# Patient Record
Sex: Female | Born: 1950
Health system: Southern US, Community
[De-identification: ages and names within clinical notes are randomized; demographics above are authoritative.]

## PROBLEM LIST (undated history)

## (undated) DIAGNOSIS — Z923 Personal history of irradiation: Secondary | ICD-10-CM

## (undated) DIAGNOSIS — T8859XA Other complications of anesthesia, initial encounter: Secondary | ICD-10-CM

## (undated) DIAGNOSIS — C50919 Malignant neoplasm of unspecified site of unspecified female breast: Secondary | ICD-10-CM

## (undated) HISTORY — PX: PLACEMENT OF BREAST IMPLANTS: SHX6334

## (undated) HISTORY — DX: Malignant neoplasm of unspecified site of unspecified female breast: C50.919

## (undated) HISTORY — PX: BREAST LUMPECTOMY: SHX2

## (undated) HISTORY — DX: Personal history of irradiation: Z92.3

## (undated) HISTORY — PX: OTHER SURGICAL HISTORY: SHX169

---

## 1999-07-11 ENCOUNTER — Encounter: Admission: RE | Admit: 1999-07-11 | Discharge: 1999-07-11 | Payer: Self-pay | Admitting: Obstetrics and Gynecology

## 1999-07-11 ENCOUNTER — Encounter: Payer: Self-pay | Admitting: Obstetrics and Gynecology

## 2000-01-18 ENCOUNTER — Inpatient Hospital Stay (HOSPITAL_COMMUNITY): Admission: EM | Admit: 2000-01-18 | Discharge: 2000-01-20 | Payer: Self-pay | Admitting: Emergency Medicine

## 2000-01-18 ENCOUNTER — Ambulatory Visit (HOSPITAL_COMMUNITY): Admission: RE | Admit: 2000-01-18 | Discharge: 2000-01-18 | Payer: Self-pay | Admitting: *Deleted

## 2000-01-19 ENCOUNTER — Encounter: Payer: Self-pay | Admitting: Otolaryngology

## 2000-10-30 ENCOUNTER — Encounter: Admission: RE | Admit: 2000-10-30 | Discharge: 2000-10-30 | Payer: Self-pay | Admitting: Obstetrics and Gynecology

## 2000-10-30 ENCOUNTER — Encounter: Payer: Self-pay | Admitting: Obstetrics and Gynecology

## 2001-02-17 ENCOUNTER — Other Ambulatory Visit: Admission: RE | Admit: 2001-02-17 | Discharge: 2001-02-17 | Payer: Self-pay | Admitting: Obstetrics and Gynecology

## 2002-04-28 ENCOUNTER — Encounter: Admission: RE | Admit: 2002-04-28 | Discharge: 2002-04-28 | Payer: Self-pay | Admitting: Obstetrics and Gynecology

## 2002-04-28 ENCOUNTER — Encounter: Payer: Self-pay | Admitting: Obstetrics and Gynecology

## 2003-06-15 ENCOUNTER — Encounter: Admission: RE | Admit: 2003-06-15 | Discharge: 2003-06-15 | Payer: Self-pay | Admitting: Obstetrics and Gynecology

## 2005-03-29 ENCOUNTER — Encounter: Admission: RE | Admit: 2005-03-29 | Discharge: 2005-03-29 | Payer: Self-pay | Admitting: Obstetrics and Gynecology

## 2006-07-10 ENCOUNTER — Encounter: Admission: RE | Admit: 2006-07-10 | Discharge: 2006-07-10 | Payer: Self-pay | Admitting: Obstetrics and Gynecology

## 2007-03-12 ENCOUNTER — Emergency Department (HOSPITAL_COMMUNITY): Admission: EM | Admit: 2007-03-12 | Discharge: 2007-03-12 | Payer: Self-pay | Admitting: Emergency Medicine

## 2007-03-19 ENCOUNTER — Encounter: Admission: RE | Admit: 2007-03-19 | Discharge: 2007-03-26 | Payer: Self-pay | Admitting: Family Medicine

## 2007-12-08 ENCOUNTER — Encounter: Admission: RE | Admit: 2007-12-08 | Discharge: 2007-12-08 | Payer: Self-pay | Admitting: Obstetrics and Gynecology

## 2008-02-08 ENCOUNTER — Emergency Department (HOSPITAL_COMMUNITY): Admission: EM | Admit: 2008-02-08 | Discharge: 2008-02-08 | Payer: Self-pay | Admitting: Emergency Medicine

## 2008-03-12 ENCOUNTER — Emergency Department (HOSPITAL_COMMUNITY): Admission: EM | Admit: 2008-03-12 | Discharge: 2008-03-12 | Payer: Self-pay | Admitting: Family Medicine

## 2009-03-20 ENCOUNTER — Encounter: Admission: RE | Admit: 2009-03-20 | Discharge: 2009-03-20 | Payer: Self-pay | Admitting: Obstetrics and Gynecology

## 2010-08-24 NOTE — H&P (Signed)
[place]. [facility]  Patient:    Loretta Griffin, Loretta Griffin                    MRN: [id number] Adm. Date:  [id number] Attending:  Serena Colonel H CC:         Coralee North, M.D., Endocrinology  Jethro Bastos, M.D.   History and Physical  ADMISSION DIAGNOSIS: Deep neck space cellulitis.  HISTORY OF PRESENT ILLNESS: This patient is a 60 year old lady who started having severe pain in the anterior neck, tenderness, and severe odynophagia about 48 hours ago that has progressively worsened.  She was evaluated by Dr. Woodfin Ganja earlier today and sent over to Dr. Coralee North for evaluation of possible thyroiditis.  She underwent CT scan of the neck which identified soft tissue edema without abscess and some slight enhancement, possible adenopathy of the lower anterolateral cervical tissues on the left side, with some extension into the superior mediastinum.  She has a history of a foreign body ingestion several years ago that was a chicken bone.  No treatment was necessary and it was felt that it passed.  She has a history of having some dental cleaning work done about ten days prior to symptoms onset.  She has no recent history of ingestion of anything that stuck in her throat or any other infectious or midline problems recently.  She is on no medications.  She does not recall having eaten anything different lately.  PAST MEDICAL HISTORY: Completely negative otherwise.  PAST SURGICAL HISTORY: Negative.  SOCIAL HISTORY: She does not smoke or drink.  She works as a Engineer, civil (consulting) at [employer].  CURRENT MEDICATIONS: None.  ALLERGIES: No known drug allergies.  PHYSICAL EXAMINATION:  GENERAL: She is a pleasant, healthy appearing woman, in moderate distress secondary to anterior neck pain and severe difficulty swallowing.  She is otherwise awake and alert.  VITAL SIGNS: On admission to the ER her temperature was 97.3 degrees, blood pressure 114/67, pulse 81,  respirations 20 and unlabored.  During the time in the emergency department she was found to have a fever to 101 degrees.  NECK: Cervical examination reveals some deep soft tissue swelling and fullness of the anterolateral aspect of the lower neck on the left side.  She has severe tenderness in this area as well.  HEENT: Ears are clear to inspection, no evidence of infection or fluid.  Nasal examination is clear and completely unremarkable.  Oral cavity and pharynx are clear without any evidence of pharyngitis.  There is no neoplasm and no evidence of inflammation.  Indirect laryngoscopy reveals normal appearing vocal cords, normal appearing epiglottis, normal vallecula and piriform sinuses.  The interarytenoid mucosa is slightly edematous.  LABORATORY DATA: Neutrophil count elevated at 82% noted on Differential; CBC report not available at that time.  I reviewed the CT scan with Dr. Myles Rosenthal and there is evidence of soft tissue edema without any evidence of fluid, gas, or abscess in the parapharyngeal or deep cervical spaces.  The mediastinum is not widened, although there is some adenopathy in the superior mediastinum and anterolaterally as well, also on the left side.  No other abnormalities are identified in the sinuses, oral cavity, or upper cervical area.  The suprahyoid neck is completely unremarkable.  IMPRESSION: Acute onset of severe odynophagia with inability to tolerate p.o.s and recent onset of fever along with swelling of the neck.  This is worrisome for early cellulitis or possibly phlegmon of the paraesophageal tissues.  I am not sure of the source of this.  The possibility of angioedema is also present except for the fever.  PLAN: The recommendation is to admit her to the hospital for intravenous antibiotics.  Will start her on Unasyn 3 g q.6h and will follow her with serial examinations.  If the findings worsen we will repeat CT scan.  If she improves, then no  further action will be necessary. DD:  01/18/00 TD:  01/19/00 Job: [id number]

## 2010-12-04 ENCOUNTER — Other Ambulatory Visit: Payer: Self-pay | Admitting: Obstetrics and Gynecology

## 2010-12-04 DIAGNOSIS — Z1231 Encounter for screening mammogram for malignant neoplasm of breast: Secondary | ICD-10-CM

## 2010-12-07 ENCOUNTER — Ambulatory Visit
Admission: RE | Admit: 2010-12-07 | Discharge: 2010-12-07 | Disposition: A | Discharge status: Home / Self Care | Payer: Commercial Managed Care - PPO | Source: Ambulatory Visit | Attending: Obstetrics and Gynecology | Admitting: Obstetrics and Gynecology

## 2010-12-07 DIAGNOSIS — Z1231 Encounter for screening mammogram for malignant neoplasm of breast: Secondary | ICD-10-CM

## 2012-09-18 ENCOUNTER — Other Ambulatory Visit: Payer: Self-pay

## 2012-09-18 DIAGNOSIS — Z1231 Encounter for screening mammogram for malignant neoplasm of breast: Secondary | ICD-10-CM

## 2012-10-21 ENCOUNTER — Ambulatory Visit
Admission: RE | Admit: 2012-10-21 | Discharge: 2012-10-21 | Disposition: A | Discharge status: Home / Self Care | Payer: 59 | Source: Ambulatory Visit

## 2012-10-21 DIAGNOSIS — Z1231 Encounter for screening mammogram for malignant neoplasm of breast: Secondary | ICD-10-CM

## 2013-11-22 ENCOUNTER — Ambulatory Visit
Admission: RE | Admit: 2013-11-22 | Discharge: 2013-11-22 | Disposition: A | Discharge status: Home / Self Care | Payer: 59 | Source: Ambulatory Visit

## 2013-11-22 ENCOUNTER — Other Ambulatory Visit: Payer: Self-pay

## 2013-11-22 ENCOUNTER — Encounter (INDEPENDENT_AMBULATORY_CARE_PROVIDER_SITE_OTHER): Payer: Self-pay

## 2013-11-22 DIAGNOSIS — Z1231 Encounter for screening mammogram for malignant neoplasm of breast: Secondary | ICD-10-CM

## 2015-04-14 MED FILL — ALPHAGAN P 0.1% DROPS: 0.1 | 25 days supply | Qty: 5 | Fill #0

## 2015-04-17 MED FILL — RESTASIS 0.05% EYE EMULSION: 0.05 | 30 days supply | Qty: 60 | Fill #0

## 2015-05-23 MED FILL — ALPHAGAN P 0.1% DROPS: 0.1 | 25 days supply | Qty: 5 | Fill #0

## 2015-07-03 DIAGNOSIS — H02423 Myogenic ptosis of bilateral eyelids: Secondary | ICD-10-CM | POA: Diagnosis not present

## 2015-07-03 DIAGNOSIS — H11433 Conjunctival hyperemia, bilateral: Secondary | ICD-10-CM | POA: Diagnosis not present

## 2015-07-03 DIAGNOSIS — H02831 Dermatochalasis of right upper eyelid: Secondary | ICD-10-CM | POA: Diagnosis not present

## 2015-07-03 DIAGNOSIS — Q100 Congenital ptosis: Secondary | ICD-10-CM | POA: Diagnosis not present

## 2015-07-03 DIAGNOSIS — H02834 Dermatochalasis of left upper eyelid: Secondary | ICD-10-CM | POA: Diagnosis not present

## 2015-07-05 MED FILL — ALPHAGAN P 0.1% DROPS: 0.1 | 25 days supply | Qty: 5 | Fill #1

## 2015-07-17 DIAGNOSIS — H53483 Generalized contraction of visual field, bilateral: Secondary | ICD-10-CM | POA: Diagnosis not present

## 2015-08-23 MED FILL — ALPHAGAN P 0.1% DROPS: 0.1 | 25 days supply | Qty: 5 | Fill #2

## 2015-09-11 DIAGNOSIS — H18412 Arcus senilis, left eye: Secondary | ICD-10-CM | POA: Diagnosis not present

## 2015-09-11 DIAGNOSIS — H18411 Arcus senilis, right eye: Secondary | ICD-10-CM | POA: Diagnosis not present

## 2015-09-11 DIAGNOSIS — Z961 Presence of intraocular lens: Secondary | ICD-10-CM | POA: Diagnosis not present

## 2015-10-04 MED FILL — ALPHAGAN P 0.1% DROPS: 0.1 | 25 days supply | Qty: 5 | Fill #3

## 2015-10-24 MED FILL — RESTASIS 0.05% EYE EMULSION: 0.05 | 30 days supply | Qty: 60 | Fill #1

## 2015-11-14 MED FILL — NEO/POLY/DEXAMET EYE OINT: 3.5-10000-0 | 3 days supply | Qty: 4 | Fill #0

## 2015-11-16 DIAGNOSIS — Z13220 Encounter for screening for lipoid disorders: Secondary | ICD-10-CM | POA: Diagnosis not present

## 2015-11-16 DIAGNOSIS — Z23 Encounter for immunization: Secondary | ICD-10-CM | POA: Diagnosis not present

## 2015-11-16 DIAGNOSIS — Z1211 Encounter for screening for malignant neoplasm of colon: Secondary | ICD-10-CM | POA: Diagnosis not present

## 2015-11-16 DIAGNOSIS — Z131 Encounter for screening for diabetes mellitus: Secondary | ICD-10-CM | POA: Diagnosis not present

## 2015-11-16 DIAGNOSIS — Z0000 Encounter for general adult medical examination without abnormal findings: Secondary | ICD-10-CM | POA: Diagnosis not present

## 2015-11-22 MED FILL — ALPHAGAN P 0.1% DROPS: 0.1 | 25 days supply | Qty: 5 | Fill #1

## 2015-11-27 DIAGNOSIS — H02834 Dermatochalasis of left upper eyelid: Secondary | ICD-10-CM | POA: Diagnosis not present

## 2015-11-27 DIAGNOSIS — H02831 Dermatochalasis of right upper eyelid: Secondary | ICD-10-CM | POA: Diagnosis not present

## 2015-11-27 DIAGNOSIS — H02423 Myogenic ptosis of bilateral eyelids: Secondary | ICD-10-CM | POA: Diagnosis not present

## 2016-01-10 DIAGNOSIS — Z1389 Encounter for screening for other disorder: Secondary | ICD-10-CM | POA: Diagnosis not present

## 2016-01-10 DIAGNOSIS — Z01419 Encounter for gynecological examination (general) (routine) without abnormal findings: Secondary | ICD-10-CM | POA: Diagnosis not present

## 2016-01-10 DIAGNOSIS — Z130 Encounter for screening for diseases of the blood and blood-forming organs and certain disorders involving the immune mechanism: Secondary | ICD-10-CM | POA: Diagnosis not present

## 2016-01-10 DIAGNOSIS — Z1231 Encounter for screening mammogram for malignant neoplasm of breast: Secondary | ICD-10-CM | POA: Diagnosis not present

## 2016-01-10 DIAGNOSIS — Z124 Encounter for screening for malignant neoplasm of cervix: Secondary | ICD-10-CM | POA: Diagnosis not present

## 2016-01-11 MED FILL — ALPHAGAN P 0.1% DROPS: 0.1 | 25 days supply | Qty: 5 | Fill #2

## 2016-02-01 MED FILL — ATOVAQUONE-PROGUANIL 250-10: 250-100 | 30 days supply | Qty: 30 | Fill #0

## 2016-02-01 MED FILL — AZITHROMYCIN 500 MG TABLET: 500 | 3 days supply | Qty: 3 | Fill #0

## 2016-02-01 MED FILL — ONDANSETRON HCL 4 MG TABLET: 4 | 4 days supply | Qty: 15 | Fill #0

## 2016-03-11 MED FILL — RESTASIS 0.05% EYE EMULSION: 0.05 | 30 days supply | Qty: 60 | Fill #2

## 2016-03-11 MED FILL — ALPHAGAN P 0.1% DROPS: 0.1 | 25 days supply | Qty: 5 | Fill #3

## 2016-03-21 DIAGNOSIS — Z961 Presence of intraocular lens: Secondary | ICD-10-CM | POA: Diagnosis not present

## 2016-03-21 DIAGNOSIS — D3131 Benign neoplasm of right choroid: Secondary | ICD-10-CM | POA: Diagnosis not present

## 2016-03-21 DIAGNOSIS — H401131 Primary open-angle glaucoma, bilateral, mild stage: Secondary | ICD-10-CM | POA: Diagnosis not present

## 2016-03-21 DIAGNOSIS — H18413 Arcus senilis, bilateral: Secondary | ICD-10-CM | POA: Diagnosis not present

## 2016-04-16 DIAGNOSIS — H903 Sensorineural hearing loss, bilateral: Secondary | ICD-10-CM | POA: Diagnosis not present

## 2016-04-19 DIAGNOSIS — H903 Sensorineural hearing loss, bilateral: Secondary | ICD-10-CM | POA: Diagnosis not present

## 2016-09-19 DIAGNOSIS — H401131 Primary open-angle glaucoma, bilateral, mild stage: Secondary | ICD-10-CM | POA: Diagnosis not present

## 2016-10-02 ENCOUNTER — Encounter: Payer: Self-pay | Admitting: Podiatry

## 2016-10-02 ENCOUNTER — Ambulatory Visit (INDEPENDENT_AMBULATORY_CARE_PROVIDER_SITE_OTHER): Payer: PPO

## 2016-10-02 ENCOUNTER — Ambulatory Visit (INDEPENDENT_AMBULATORY_CARE_PROVIDER_SITE_OTHER): Payer: PPO | Admitting: Podiatry

## 2016-10-02 DIAGNOSIS — M84374A Stress fracture, right foot, initial encounter for fracture: Secondary | ICD-10-CM | POA: Diagnosis not present

## 2016-10-02 DIAGNOSIS — R87810 Cervical high risk human papillomavirus (HPV) DNA test positive: Secondary | ICD-10-CM | POA: Insufficient documentation

## 2016-10-02 DIAGNOSIS — M7751 Other enthesopathy of right foot: Secondary | ICD-10-CM | POA: Diagnosis not present

## 2016-10-02 DIAGNOSIS — M779 Enthesopathy, unspecified: Principal | ICD-10-CM

## 2016-10-02 DIAGNOSIS — M778 Other enthesopathies, not elsewhere classified: Secondary | ICD-10-CM

## 2016-10-02 DIAGNOSIS — R81 Glycosuria: Secondary | ICD-10-CM | POA: Insufficient documentation

## 2016-10-02 DIAGNOSIS — D259 Leiomyoma of uterus, unspecified: Secondary | ICD-10-CM | POA: Insufficient documentation

## 2016-10-02 NOTE — Progress Notes (Signed)
Subjective:    Patient ID: Loretta Griffin, female   DOB: 66 y.o.   MRN: [id number]   HPI patient states that she's been walking a lot for exercise and getting ready for a hike and she started develop severe pain in her right foot approximate 4 days ago and she cannot bear weight on her forefoot. States that she does not remember specific injury    Review of Systems  All other systems reviewed and are negative.       Objective:  Physical Exam  Cardiovascular: Intact distal pulses.   Musculoskeletal: Normal range of motion.  Neurological: She is alert.  Skin: Skin is warm.  Nursing note and vitals reviewed.  neurovascular status intact muscle strength adequate range of motion within normal limits with patient found to have exquisite discomfort in the fourth metatarsal distal shaft that is very painful when pressed. Patient states there is swelling in the area and she cannot bear weight down on it and is noted to have good digital perfusion and well oriented 3     Assessment:    Probability for stress fracture of the right fourth metatarsal distal shaft     Plan:   H&P x-ray performed and discussed. Due to the acute nature of symptoms and the fact we want to keep this from progressing and becoming worse fracture I did go ahead today and applied air fracture walker to completely immobilize the plantar foot and stop weightbearing on the metatarsal and patient tolerated well and was given all instructions on usage. She'll be seen back again in 2 weeks for reevaluation and hopefully we will be able to start getting her out of the boot at that time  X-ray indicates what is probably the beginning of a small crack line in the distal fourth metatarsal shaft right

## 2016-10-02 NOTE — Progress Notes (Signed)
   Subjective:    Patient ID: Loretta Griffin, female    DOB: 10-05-1950, 66 y.o.   MRN: [id number]  HPI Chief Complaint  Patient presents with  . Foot Pain    Forefoot right - patient states that she has been training for a big walk in [place] next year and noticed 4 days ago she was having a lot of pain and swelling, wrapping with tape      Review of Systems  All other systems reviewed and are negative.      Objective:   Physical Exam        Assessment & Plan:

## 2016-10-16 ENCOUNTER — Ambulatory Visit (INDEPENDENT_AMBULATORY_CARE_PROVIDER_SITE_OTHER): Payer: PPO | Admitting: Podiatry

## 2016-10-16 ENCOUNTER — Encounter: Payer: Self-pay | Admitting: Podiatry

## 2016-10-16 VITALS — BP 131/76 | HR 66 | Resp 16

## 2016-10-16 DIAGNOSIS — M84374A Stress fracture, right foot, initial encounter for fracture: Secondary | ICD-10-CM | POA: Diagnosis not present

## 2016-10-16 NOTE — Progress Notes (Signed)
Subjective:    Patient ID: Loretta Griffin, female   DOB: 66 y.o.   MRN: [id number]   HPI patient states she's improved but still having some discomfort and states that she is doing the [employer] in a year and is concerned because her foot still feels have    ROS      Objective:  Physical Exam neurovascular status intact with patient still having some edema in the dorsum of the right foot but it is improved from previous     Assessment:   Probable stress fracture right improving with boot usage with possibility for low-grade inflammatory condition     Plan:    H&P and education rendered concerning condition. At this point were going to continue boot usage with gradual reduction over the next 4 weeks reevaluate and decide what type of orthotic would be best

## 2016-10-23 ENCOUNTER — Ambulatory Visit: Payer: Self-pay | Admitting: Podiatry

## 2016-11-20 ENCOUNTER — Encounter: Payer: Self-pay | Admitting: Podiatry

## 2016-11-20 ENCOUNTER — Ambulatory Visit (INDEPENDENT_AMBULATORY_CARE_PROVIDER_SITE_OTHER): Payer: PPO | Admitting: Podiatry

## 2016-11-20 DIAGNOSIS — Z23 Encounter for immunization: Secondary | ICD-10-CM | POA: Diagnosis not present

## 2016-11-20 DIAGNOSIS — M7751 Other enthesopathy of right foot: Secondary | ICD-10-CM

## 2016-11-20 DIAGNOSIS — Z1159 Encounter for screening for other viral diseases: Secondary | ICD-10-CM | POA: Diagnosis not present

## 2016-11-20 DIAGNOSIS — M84374A Stress fracture, right foot, initial encounter for fracture: Secondary | ICD-10-CM | POA: Diagnosis not present

## 2016-11-20 DIAGNOSIS — M779 Enthesopathy, unspecified: Secondary | ICD-10-CM

## 2016-11-20 DIAGNOSIS — E7800 Pure hypercholesterolemia, unspecified: Secondary | ICD-10-CM | POA: Diagnosis not present

## 2016-11-20 DIAGNOSIS — Z1211 Encounter for screening for malignant neoplasm of colon: Secondary | ICD-10-CM | POA: Diagnosis not present

## 2016-11-20 DIAGNOSIS — M67449 Ganglion, unspecified hand: Secondary | ICD-10-CM | POA: Diagnosis not present

## 2016-11-20 DIAGNOSIS — E2839 Other primary ovarian failure: Secondary | ICD-10-CM | POA: Diagnosis not present

## 2016-11-20 DIAGNOSIS — Z0000 Encounter for general adult medical examination without abnormal findings: Secondary | ICD-10-CM | POA: Diagnosis not present

## 2016-11-20 DIAGNOSIS — M778 Other enthesopathies, not elsewhere classified: Secondary | ICD-10-CM

## 2016-11-20 NOTE — Progress Notes (Signed)
Subjective:    Patient ID: Loretta Griffin, female   DOB: 66 y.o.   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 At this point I do think a very soft type orthotic with probable P cell to reduce forefoot pressures with metatarsal padding would be of benefit the patient. Patient is scheduled to see head orthotist for evaluation and orthotics

## 2016-11-24 DIAGNOSIS — Z1211 Encounter for screening for malignant neoplasm of colon: Secondary | ICD-10-CM | POA: Diagnosis not present

## 2016-12-05 ENCOUNTER — Ambulatory Visit: Payer: Self-pay | Admitting: Orthotics

## 2016-12-05 DIAGNOSIS — M84374A Stress fracture, right foot, initial encounter for fracture: Secondary | ICD-10-CM

## 2016-12-05 DIAGNOSIS — M79676 Pain in unspecified toe(s): Secondary | ICD-10-CM

## 2016-12-05 NOTE — Progress Notes (Signed)
Patient presents today for CMFO upon recommendation of Dr. Paulla Dolly.  She wants a super soft FO to take pressure off ball of foot.  Plan on [employer] same w/ poron padding and spenco topcover.

## 2016-12-19 ENCOUNTER — Ambulatory Visit: Payer: PPO | Admitting: Orthotics

## 2016-12-19 DIAGNOSIS — M779 Enthesopathy, unspecified: Secondary | ICD-10-CM

## 2016-12-19 DIAGNOSIS — M84374A Stress fracture, right foot, initial encounter for fracture: Secondary | ICD-10-CM

## 2016-12-19 DIAGNOSIS — M778 Other enthesopathies, not elsewhere classified: Secondary | ICD-10-CM

## 2016-12-19 NOTE — Progress Notes (Signed)
Patient came in today to pick up custom made foot orthotics.  The goals were accomplished and the patient reported no dissatisfaction with said orthotics.  Patient was advised of breakin period and how to report any issues. 

## 2016-12-20 DIAGNOSIS — M67441 Ganglion, right hand: Secondary | ICD-10-CM | POA: Diagnosis not present

## 2016-12-25 DIAGNOSIS — E2839 Other primary ovarian failure: Secondary | ICD-10-CM | POA: Diagnosis not present

## 2016-12-25 DIAGNOSIS — M8588 Other specified disorders of bone density and structure, other site: Secondary | ICD-10-CM | POA: Diagnosis not present

## 2017-01-02 ENCOUNTER — Ambulatory Visit: Payer: PPO | Admitting: Orthotics

## 2017-01-02 DIAGNOSIS — M84374A Stress fracture, right foot, initial encounter for fracture: Secondary | ICD-10-CM

## 2017-01-02 NOTE — Progress Notes (Signed)
Orthotics still wrong.  Does not hug arch nearly enough.  Will send to [name] a pair of patient's OTS f/o to compare along with photographs.

## 2017-01-17 DIAGNOSIS — Z01419 Encounter for gynecological examination (general) (routine) without abnormal findings: Secondary | ICD-10-CM | POA: Diagnosis not present

## 2017-01-17 DIAGNOSIS — Z1231 Encounter for screening mammogram for malignant neoplasm of breast: Secondary | ICD-10-CM | POA: Diagnosis not present

## 2017-01-17 DIAGNOSIS — Z124 Encounter for screening for malignant neoplasm of cervix: Secondary | ICD-10-CM | POA: Diagnosis not present

## 2017-01-21 DIAGNOSIS — H401131 Primary open-angle glaucoma, bilateral, mild stage: Secondary | ICD-10-CM | POA: Diagnosis not present

## 2017-01-23 ENCOUNTER — Ambulatory Visit: Payer: PPO | Admitting: Orthotics

## 2017-01-23 DIAGNOSIS — M778 Other enthesopathies, not elsewhere classified: Secondary | ICD-10-CM

## 2017-01-23 DIAGNOSIS — M84374A Stress fracture, right foot, initial encounter for fracture: Secondary | ICD-10-CM

## 2017-01-23 DIAGNOSIS — M779 Enthesopathy, unspecified: Secondary | ICD-10-CM

## 2017-01-23 NOTE — Progress Notes (Signed)
Patient came in today to p/u adjusted F/O (arch height increased).  She seemed pleased with the fit and function.

## 2017-07-08 ENCOUNTER — Ambulatory Visit: Payer: PPO | Admitting: Orthotics

## 2017-07-08 DIAGNOSIS — M84374A Stress fracture, right foot, initial encounter for fracture: Secondary | ICD-10-CM

## 2017-07-08 DIAGNOSIS — M778 Other enthesopathies, not elsewhere classified: Secondary | ICD-10-CM

## 2017-07-08 DIAGNOSIS — M779 Enthesopathy, unspecified: Secondary | ICD-10-CM

## 2017-07-08 NOTE — Progress Notes (Signed)
Patient is going on a 500 mile hike through [place] and is concerned her f/o will not provide enough cushioning; I am sending back to [facility] to make the following adjustments:  Size 43 European, LW accomodative w/ cork arch reinforcement, remove met pad, add scaphod, and perf cover.

## 2017-07-22 ENCOUNTER — Other Ambulatory Visit: Payer: PPO | Admitting: Orthotics

## 2017-07-28 ENCOUNTER — Other Ambulatory Visit: Payer: PPO | Admitting: Orthotics

## 2017-07-31 ENCOUNTER — Ambulatory Visit: Payer: PPO | Admitting: Orthotics

## 2017-07-31 DIAGNOSIS — M84374A Stress fracture, right foot, initial encounter for fracture: Secondary | ICD-10-CM

## 2017-07-31 DIAGNOSIS — M779 Enthesopathy, unspecified: Secondary | ICD-10-CM

## 2017-07-31 DIAGNOSIS — M778 Other enthesopathies, not elsewhere classified: Secondary | ICD-10-CM

## 2017-08-01 DIAGNOSIS — H401131 Primary open-angle glaucoma, bilateral, mild stage: Secondary | ICD-10-CM | POA: Diagnosis not present

## 2017-08-01 NOTE — Progress Notes (Signed)
Patient picked up corrected f/o.  She is planning on doing an extreme hike in [place] and walks 14 miles a day in training. She was advised to wear f/o gradually in training and to make an appointment with me next week to f/up before her trip.

## 2017-08-04 ENCOUNTER — Ambulatory Visit: Payer: PPO | Admitting: Orthotics

## 2017-08-04 DIAGNOSIS — M84374A Stress fracture, right foot, initial encounter for fracture: Secondary | ICD-10-CM

## 2017-08-04 NOTE — Progress Notes (Signed)
Adjustments made to f/o:  Added valgus wedge to lay in shoe.

## 2017-08-05 ENCOUNTER — Ambulatory Visit: Payer: PPO | Admitting: Orthotics

## 2017-08-05 DIAGNOSIS — M84374A Stress fracture, right foot, initial encounter for fracture: Secondary | ICD-10-CM

## 2017-08-05 NOTE — Progress Notes (Signed)
Sending f/o back for wider base, 4* valgus posting, lateral wedge

## 2017-08-07 ENCOUNTER — Other Ambulatory Visit: Payer: PPO | Admitting: Orthotics

## 2017-08-14 ENCOUNTER — Telehealth: Payer: Self-pay | Admitting: Podiatry

## 2017-08-14 NOTE — Telephone Encounter (Signed)
Pt called to see if her orthotics are back yet. Pt stated Loretta Griffin told her she could pick them up when they came in and she is leaving next Wednesday for her pilgrimage.  I called [facility] and spoke to [facility] and she said they should ship today or tomorrow but should be in the office early next week at the latest.  I notified pt of this and told her I would call her when they come in.

## 2017-08-19 ENCOUNTER — Other Ambulatory Visit: Payer: PPO | Admitting: Orthotics

## 2018-01-01 DIAGNOSIS — E7800 Pure hypercholesterolemia, unspecified: Secondary | ICD-10-CM | POA: Diagnosis not present

## 2018-01-01 DIAGNOSIS — Z23 Encounter for immunization: Secondary | ICD-10-CM | POA: Diagnosis not present

## 2018-01-01 DIAGNOSIS — Z0000 Encounter for general adult medical examination without abnormal findings: Secondary | ICD-10-CM | POA: Diagnosis not present

## 2018-01-01 DIAGNOSIS — Z1211 Encounter for screening for malignant neoplasm of colon: Secondary | ICD-10-CM | POA: Diagnosis not present

## 2018-01-01 DIAGNOSIS — Z131 Encounter for screening for diabetes mellitus: Secondary | ICD-10-CM | POA: Diagnosis not present

## 2018-01-02 DIAGNOSIS — Z0000 Encounter for general adult medical examination without abnormal findings: Secondary | ICD-10-CM | POA: Diagnosis not present

## 2018-01-21 DIAGNOSIS — Z1389 Encounter for screening for other disorder: Secondary | ICD-10-CM | POA: Diagnosis not present

## 2018-01-21 DIAGNOSIS — Z01419 Encounter for gynecological examination (general) (routine) without abnormal findings: Secondary | ICD-10-CM | POA: Diagnosis not present

## 2018-01-21 DIAGNOSIS — R8290 Unspecified abnormal findings in urine: Secondary | ICD-10-CM | POA: Diagnosis not present

## 2018-01-21 DIAGNOSIS — Z130 Encounter for screening for diseases of the blood and blood-forming organs and certain disorders involving the immune mechanism: Secondary | ICD-10-CM | POA: Diagnosis not present

## 2018-01-21 DIAGNOSIS — Z1231 Encounter for screening mammogram for malignant neoplasm of breast: Secondary | ICD-10-CM | POA: Diagnosis not present

## 2018-01-21 DIAGNOSIS — Z124 Encounter for screening for malignant neoplasm of cervix: Secondary | ICD-10-CM | POA: Diagnosis not present

## 2018-01-29 DIAGNOSIS — H401131 Primary open-angle glaucoma, bilateral, mild stage: Secondary | ICD-10-CM | POA: Diagnosis not present

## 2018-07-03 DIAGNOSIS — E7800 Pure hypercholesterolemia, unspecified: Secondary | ICD-10-CM | POA: Diagnosis not present

## 2018-11-11 DIAGNOSIS — H401131 Primary open-angle glaucoma, bilateral, mild stage: Secondary | ICD-10-CM | POA: Diagnosis not present

## 2019-01-04 DIAGNOSIS — Z23 Encounter for immunization: Secondary | ICD-10-CM | POA: Diagnosis not present

## 2019-01-04 DIAGNOSIS — E7800 Pure hypercholesterolemia, unspecified: Secondary | ICD-10-CM | POA: Diagnosis not present

## 2019-01-06 DIAGNOSIS — Z0000 Encounter for general adult medical examination without abnormal findings: Secondary | ICD-10-CM | POA: Diagnosis not present

## 2019-01-25 DIAGNOSIS — Z124 Encounter for screening for malignant neoplasm of cervix: Secondary | ICD-10-CM | POA: Diagnosis not present

## 2019-01-25 DIAGNOSIS — Z1231 Encounter for screening mammogram for malignant neoplasm of breast: Secondary | ICD-10-CM | POA: Diagnosis not present

## 2019-01-25 DIAGNOSIS — Z130 Encounter for screening for diseases of the blood and blood-forming organs and certain disorders involving the immune mechanism: Secondary | ICD-10-CM | POA: Diagnosis not present

## 2019-01-25 DIAGNOSIS — R8290 Unspecified abnormal findings in urine: Secondary | ICD-10-CM | POA: Diagnosis not present

## 2019-01-25 DIAGNOSIS — Z01419 Encounter for gynecological examination (general) (routine) without abnormal findings: Secondary | ICD-10-CM | POA: Diagnosis not present

## 2019-01-25 DIAGNOSIS — E669 Obesity, unspecified: Secondary | ICD-10-CM | POA: Diagnosis not present

## 2019-01-25 DIAGNOSIS — Z1389 Encounter for screening for other disorder: Secondary | ICD-10-CM | POA: Diagnosis not present

## 2019-01-26 DIAGNOSIS — L814 Other melanin hyperpigmentation: Secondary | ICD-10-CM | POA: Diagnosis not present

## 2019-01-26 DIAGNOSIS — L820 Inflamed seborrheic keratosis: Secondary | ICD-10-CM | POA: Diagnosis not present

## 2019-01-26 DIAGNOSIS — L821 Other seborrheic keratosis: Secondary | ICD-10-CM | POA: Diagnosis not present

## 2019-01-27 DIAGNOSIS — Z1211 Encounter for screening for malignant neoplasm of colon: Secondary | ICD-10-CM | POA: Diagnosis not present

## 2019-02-11 ENCOUNTER — Ambulatory Visit: Payer: PPO | Admitting: Orthotics

## 2019-02-11 ENCOUNTER — Other Ambulatory Visit: Payer: Self-pay

## 2019-02-11 ENCOUNTER — Ambulatory Visit: Payer: PPO

## 2019-02-11 ENCOUNTER — Ambulatory Visit (INDEPENDENT_AMBULATORY_CARE_PROVIDER_SITE_OTHER): Payer: PPO

## 2019-02-11 ENCOUNTER — Encounter: Payer: Self-pay | Admitting: Podiatry

## 2019-02-11 ENCOUNTER — Ambulatory Visit: Payer: PPO | Admitting: Podiatry

## 2019-02-11 VITALS — Wt 180.0 lb

## 2019-02-11 DIAGNOSIS — M79671 Pain in right foot: Secondary | ICD-10-CM

## 2019-02-11 DIAGNOSIS — M76822 Posterior tibial tendinitis, left leg: Secondary | ICD-10-CM | POA: Diagnosis not present

## 2019-02-11 DIAGNOSIS — M25572 Pain in left ankle and joints of left foot: Secondary | ICD-10-CM

## 2019-02-11 DIAGNOSIS — M76821 Posterior tibial tendinitis, right leg: Secondary | ICD-10-CM | POA: Diagnosis not present

## 2019-02-11 NOTE — Progress Notes (Addendum)
Subjective:   Patient ID: Loretta Griffin, female   DOB: 68 y.o.   MRN: [id number]   HPI Patient presents with a lot of pain in the inside of the left ankle and states that it is been hurting her for several months and she likes to be active in height but has not been able to.  Does not remember injury.  States that her orthotics are worn out and Ace really help her with her foot pain but they are no longer holding up the arch properly   ROS      Objective:  Physical Exam  Neurovascular status intact exquisite discomfort noted medial ankle in the posterior tibial and tendon and its insertion into the navicular with no indication of muscle strength loss     Assessment:  Acute posterior tibial tendinitis left with loss of arch height and orthotics which have lost their ability to hold her properly as part of the complicating factor     Plan:  H&P condition reviewed sterile prep done and injected the tendon near its insertion 3 mg dexamethasone Kenalog 5 mg Xylocaine applied fascial brace with instructions on usage of a brace.  I went ahead today and I did cast for functional orthotics to lift up the arch and take stress off the medial arch and posterior tibial tendon also  X-rays were negative for fracture did indicate mild depression of the arch left

## 2019-02-11 NOTE — Progress Notes (Signed)
Repeating may 2019 order w/ extra arch padding as well as neutral RF

## 2019-02-12 ENCOUNTER — Other Ambulatory Visit: Payer: Self-pay | Admitting: Podiatry

## 2019-02-12 DIAGNOSIS — M76822 Posterior tibial tendinitis, left leg: Secondary | ICD-10-CM

## 2019-03-25 ENCOUNTER — Other Ambulatory Visit: Payer: Self-pay

## 2019-03-25 ENCOUNTER — Ambulatory Visit (INDEPENDENT_AMBULATORY_CARE_PROVIDER_SITE_OTHER): Payer: Self-pay | Admitting: Orthotics

## 2019-03-25 DIAGNOSIS — M76821 Posterior tibial tendinitis, right leg: Secondary | ICD-10-CM

## 2019-03-25 DIAGNOSIS — M25572 Pain in left ankle and joints of left foot: Secondary | ICD-10-CM

## 2019-03-25 NOTE — Progress Notes (Signed)
Patient came in today to pick up custom made foot orthotics.  The goals were accomplished and the patient reported no dissatisfaction with said orthotics.  Patient was advised of breakin period and how to report any issues.Patient came in today to pick up custom made foot orthotics.  The goals were accomplished and the patient reported no dissatisfaction with said orthotics.  Patient was advised of breakin period and how to report any issues. 

## 2019-05-09 ENCOUNTER — Ambulatory Visit: Payer: PPO

## 2019-05-16 ENCOUNTER — Ambulatory Visit: Payer: PPO | Attending: Internal Medicine

## 2019-05-16 DIAGNOSIS — Z23 Encounter for immunization: Secondary | ICD-10-CM

## 2019-05-16 NOTE — Progress Notes (Signed)
   Covid-19 Vaccination Clinic  Name:  Loretta Griffin    MRN: [id number] DOB: 1950/06/26  05/16/2019  [name] was observed post Covid-19 immunization for 15 minutes without incidence. She was provided with Vaccine Information Sheet and instruction to access the V-Safe system.   [name] was instructed to call 911 with any severe reactions post vaccine: Marland Kitchen Difficulty breathing  . Swelling of your face and throat  . A fast heartbeat  . A bad rash all over your body  . Dizziness and weakness    Immunizations Administered    Name Date Dose VIS Date Route   Pfizer COVID-19 Vaccine 05/16/2019  9:43 AM 0.3 mL 03/19/2019 Intramuscular   Manufacturer: [place]   Lot: [id number]   [place]: [id number]

## 2019-05-20 ENCOUNTER — Ambulatory Visit: Payer: PPO

## 2019-06-09 ENCOUNTER — Ambulatory Visit: Payer: PPO | Attending: Internal Medicine

## 2019-06-09 DIAGNOSIS — Z23 Encounter for immunization: Secondary | ICD-10-CM

## 2019-06-09 NOTE — Progress Notes (Signed)
   Covid-19 Vaccination Clinic  Name:  Loretta Griffin    MRN: [id number] DOB: 14-Mar-1951  06/09/2019  [name] was observed post Covid-19 immunization for 15 minutes without incident. She was provided with Vaccine Information Sheet and instruction to access the V-Safe system.   [name] was instructed to call 911 with any severe reactions post vaccine: Marland Kitchen Difficulty breathing  . Swelling of face and throat  . A fast heartbeat  . A bad rash all over body  . Dizziness and weakness   Immunizations Administered    Name Date Dose VIS Date Route   Pfizer COVID-19 Vaccine 06/09/2019  4:21 PM 0.3 mL 03/19/2019 Intramuscular   Manufacturer: [place]   Lot: [id number]   [place]: [id number]

## 2019-07-21 DIAGNOSIS — H401131 Primary open-angle glaucoma, bilateral, mild stage: Secondary | ICD-10-CM | POA: Diagnosis not present

## 2019-11-29 ENCOUNTER — Other Ambulatory Visit: Payer: Self-pay

## 2019-11-29 ENCOUNTER — Ambulatory Visit
Admission: RE | Admit: 2019-11-29 | Discharge: 2019-11-29 | Disposition: A | Discharge status: Home / Self Care | Payer: PPO | Source: Ambulatory Visit | Attending: Family Medicine | Admitting: Family Medicine

## 2019-11-29 ENCOUNTER — Encounter: Payer: Self-pay | Admitting: Family Medicine

## 2019-11-29 ENCOUNTER — Ambulatory Visit: Payer: PPO | Admitting: Family Medicine

## 2019-11-29 VITALS — BP 122/82 | Ht 70.0 in | Wt 185.0 lb

## 2019-11-29 DIAGNOSIS — M25462 Effusion, left knee: Secondary | ICD-10-CM | POA: Diagnosis not present

## 2019-11-29 DIAGNOSIS — G8929 Other chronic pain: Secondary | ICD-10-CM | POA: Diagnosis not present

## 2019-11-29 DIAGNOSIS — M25562 Pain in left knee: Secondary | ICD-10-CM | POA: Diagnosis not present

## 2019-11-29 DIAGNOSIS — M1712 Unilateral primary osteoarthritis, left knee: Secondary | ICD-10-CM | POA: Diagnosis not present

## 2019-11-29 MED ORDER — METHYLPREDNISOLONE ACETATE 40 MG/ML IJ SUSP
40.0000 mg | Freq: Once | INTRAMUSCULAR | Status: AC
  Administered 2019-11-29: 40 mg via INTRA_ARTICULAR

## 2019-11-29 NOTE — Patient Instructions (Signed)
Your pain is due to arthritis vs a degenerative medial meniscus tear - both are treated similarly. Get x-rays after you leave today. These are the different medications you can take for this: Tylenol 500mg  1-2 tabs three times a day for pain. Capsaicin, aspercreme, or biofreeze topically up to four times a day may also help with pain. Some supplements that may help for arthritis: Boswellia extract, curcumin, pycnogenol Cortisone injections are an option - you were given this today. If cortisone injections do not help, there are different types of shots that may help but they take longer to take effect. It's important that you continue to stay active. Straight leg raises, knee extensions 3 sets of 10 once a day (add ankle weight if these become too easy). Consider physical therapy to strengthen muscles around the joint that hurts to take pressure off of the joint itself. Shoe inserts with good arch support may be helpful. Heat or ice 15 minutes at a time 3-4 times a day as needed to help with pain. Water aerobics and cycling with low resistance are the best two types of exercise for arthritis though any exercise is ok as long as it doesn't worsen the pain. Follow up with me in 1 month.

## 2019-11-29 NOTE — Progress Notes (Signed)
PCP: Glenis Smoker, MD  Subjective:   HPI: Patient is a 69 y.o. female here for left knee pain.  Patient denies acute injury or trauma. She states back in January she was renovating a cottage about 2 steps up on a ladder leaning forward when she noticed later that night her right knee felt full. Since that time she has had a little bit of pain with both knees though the right 1 had completely improved. Then for the past month the left knee has hurt more medially. This reached a peak about 2 weeks ago with more severe pain. She recalls having left arthroscopic knee surgery about 30 years ago. She has tried CBD cream, Aspercreme, Voltaren gel. She also tried a TENS unit which helped. Has been taking Tylenol 1000 mg every 6 hours.  History reviewed. No pertinent past medical history.  Current Outpatient Medications on File Prior to Visit  Medication Sig Dispense Refill  . ALPHAGAN P 0.1 % SOLN   1  . RESTASIS MULTIDOSE 0.05 % ophthalmic emulsion INT 1 GTT INTO OU BID  3   No current facility-administered medications on file prior to visit.    History reviewed. No pertinent surgical history.  Allergies  Allergen Reactions  . Bimatoprost   . Brimonidine Tartrate-Timolol   . Brinzolamide-Brimonidine   . Codeine     Social History   Socioeconomic History  . Marital status: Single    Spouse name: Not on file  . Number of children: Not on file  . Years of education: Not on file  . Highest education level: Not on file  Occupational History  . Not on file  Tobacco Use  . Smoking status: Never Smoker  . Smokeless tobacco: Never Used  Substance and Sexual Activity  . Alcohol use: No  . Drug use: Not on file  . Sexual activity: Not on file  Other Topics Concern  . Not on file  Social History Narrative  . Not on file   Social Determinants of Health   Financial Resource Strain:   . Difficulty of Paying Living Expenses: Not on file  Food Insecurity:   . Worried  About Charity fundraiser in the Last Year: Not on file  . Ran Out of Food in the Last Year: Not on file  Transportation Needs:   . Lack of Transportation (Medical): Not on file  . Lack of Transportation (Non-Medical): Not on file  Physical Activity:   . Days of Exercise per Week: Not on file  . Minutes of Exercise per Session: Not on file  Stress:   . Feeling of Stress : Not on file  Social Connections:   . Frequency of Communication with Friends and Family: Not on file  . Frequency of Social Gatherings with Friends and Family: Not on file  . Attends Religious Services: Not on file  . Active Member of Clubs or Organizations: Not on file  . Attends Archivist Meetings: Not on file  . Marital Status: Not on file  Intimate Partner Violence:   . Fear of Current or Ex-Partner: Not on file  . Emotionally Abused: Not on file  . Physically Abused: Not on file  . Sexually Abused: Not on file    History reviewed. No pertinent family history.  BP 122/82   Ht 5\' 10"  (1.778 m)   Wt 185 lb (83.9 kg)   BMI 26.54 kg/m   Review of Systems: See HPI above.     Objective:  Physical  Exam:  Gen: NAD, comfortable in exam room  Left knee: No gross deformity, ecchymoses, effusion. TTP medial joint line.  No other tenderness. FROM with 5/5 strength flexion and extension. Negative ant/post drawers. Negative valgus/varus testing. Negative lachmans. Mild pain medially with mcmurrays, apleys. NV intact distally.   Assessment & Plan:  1. Left knee pain -consistent with arthritis versus degenerative medial meniscus tear.  We will go ahead with radiographs today.  We discussed Tylenol, topical medicine, supplements that may help.  Intra-articular cortisone injection given today.  Discussed quad strengthening.  Heat or ice if needed.  Follow-up in 1 month.  After informed written consent timeout was performed, patient was seated on exam table. Left knee was prepped with alcohol swab and  utilizing anteromedial approach, patient's left knee was injected intraarticularly with 3:1 bupivicaine: depomedrol. Patient tolerated the procedure well without immediate complications.

## 2020-01-10 DIAGNOSIS — E7800 Pure hypercholesterolemia, unspecified: Secondary | ICD-10-CM | POA: Diagnosis not present

## 2020-01-10 DIAGNOSIS — Z23 Encounter for immunization: Secondary | ICD-10-CM | POA: Diagnosis not present

## 2020-01-10 DIAGNOSIS — Z1211 Encounter for screening for malignant neoplasm of colon: Secondary | ICD-10-CM | POA: Diagnosis not present

## 2020-01-10 DIAGNOSIS — M8580 Other specified disorders of bone density and structure, unspecified site: Secondary | ICD-10-CM | POA: Diagnosis not present

## 2020-01-10 DIAGNOSIS — Z0000 Encounter for general adult medical examination without abnormal findings: Secondary | ICD-10-CM | POA: Diagnosis not present

## 2020-01-11 DIAGNOSIS — Z1211 Encounter for screening for malignant neoplasm of colon: Secondary | ICD-10-CM | POA: Diagnosis not present

## 2020-01-17 ENCOUNTER — Encounter: Payer: Self-pay | Admitting: Family Medicine

## 2020-01-17 ENCOUNTER — Ambulatory Visit: Payer: PPO | Admitting: Family Medicine

## 2020-01-17 ENCOUNTER — Other Ambulatory Visit: Payer: Self-pay

## 2020-01-17 VITALS — BP 118/78 | Ht 68.5 in | Wt 185.0 lb

## 2020-01-17 DIAGNOSIS — G8929 Other chronic pain: Secondary | ICD-10-CM

## 2020-01-17 DIAGNOSIS — M25562 Pain in left knee: Secondary | ICD-10-CM

## 2020-01-17 NOTE — Patient Instructions (Signed)
Your pain is due to arthritis vs a degenerative medial meniscus tear. These are the different medications you can take for this: Tylenol 500mg  1-2 tabs three times a day for pain. Capsaicin, aspercreme, or biofreeze topically up to four times a day may also help with pain. Some supplements that may help for arthritis: Boswellia extract, curcumin, pycnogenol Consider MRI or physical therapy as the next step - let me know if you want to pursue any of these. It's important that you continue to stay active - when walking you need a level surface though. Straight leg raises, knee extensions 3 sets of 10 once a day (add ankle weight if these become too easy). Shoe inserts with good arch support may be helpful. Heat or ice 15 minutes at a time 3-4 times a day as needed to help with pain. Water aerobics and cycling with low resistance are the best two types of exercise for arthritis though any exercise is ok as long as it doesn't worsen the pain. Follow up with me in 1 month otherwise.

## 2020-01-17 NOTE — Progress Notes (Signed)
PCP: Glenis Smoker, MD  Subjective:   HPI: Patient is a 69 y.o. female here for left knee pain.  8/23: Patient denies acute injury or trauma. She states back in January she was renovating a cottage about 2 steps up on a ladder leaning forward when she noticed later that night her right knee felt full. Since that time she has had a little bit of pain with both knees though the right 1 had completely improved. Then for the past month the left knee has hurt more medially. This reached a peak about 2 weeks ago with more severe pain. She recalls having left arthroscopic knee surgery about 30 years ago. She has tried CBD cream, Aspercreme, Voltaren gel. She also tried a TENS unit which helped. Has been taking Tylenol 1000 mg every 6 hours.  10/11: Patient reports she hasn't noticed much change since last visit. Injection didn't seem to provide relief to her knee pain. Pain is primarily medial. Swelling improved with ice pack. Is taking tylenol, aspirin, using topicals and has started taking boswellia. No catching, locking.  History reviewed. No pertinent past medical history.  Current Outpatient Medications on File Prior to Visit  Medication Sig Dispense Refill  . ALPHAGAN P 0.1 % SOLN   1  . RESTASIS MULTIDOSE 0.05 % ophthalmic emulsion INT 1 GTT INTO OU BID  3   No current facility-administered medications on file prior to visit.    History reviewed. No pertinent surgical history.  Allergies  Allergen Reactions  . Bimatoprost   . Brimonidine Tartrate-Timolol   . Brinzolamide-Brimonidine   . Codeine     Social History   Socioeconomic History  . Marital status: Single    Spouse name: Not on file  . Number of children: Not on file  . Years of education: Not on file  . Highest education level: Not on file  Occupational History  . Not on file  Tobacco Use  . Smoking status: Never Smoker  . Smokeless tobacco: Never Used  Substance and Sexual Activity  .  Alcohol use: No  . Drug use: Not on file  . Sexual activity: Not on file  Other Topics Concern  . Not on file  Social History Narrative  . Not on file   Social Determinants of Health   Financial Resource Strain:   . Difficulty of Paying Living Expenses: Not on file  Food Insecurity:   . Worried About Charity fundraiser in the Last Year: Not on file  . Ran Out of Food in the Last Year: Not on file  Transportation Needs:   . Lack of Transportation (Medical): Not on file  . Lack of Transportation (Non-Medical): Not on file  Physical Activity:   . Days of Exercise per Week: Not on file  . Minutes of Exercise per Session: Not on file  Stress:   . Feeling of Stress : Not on file  Social Connections:   . Frequency of Communication with Friends and Family: Not on file  . Frequency of Social Gatherings with Friends and Family: Not on file  . Attends Religious Services: Not on file  . Active Member of Clubs or Organizations: Not on file  . Attends Archivist Meetings: Not on file  . Marital Status: Not on file  Intimate Partner Violence:   . Fear of Current or Ex-Partner: Not on file  . Emotionally Abused: Not on file  . Physically Abused: Not on file  . Sexually Abused: Not on file  History reviewed. No pertinent family history.  BP 118/78   Ht 5' 8.5" (1.74 m)   Wt 185 lb (83.9 kg)   BMI 27.72 kg/m   Review of Systems: See HPI above.     Objective:  Physical Exam:  Gen: NAD, comfortable in exam room  Left knee: No gross deformity, ecchymoses, swelling. TTP medial joint line.  No other tenderness. FROM with normal strength. Negative ant/post drawers. Negative valgus/varus testing. Negative lachmans. Negative mcmurrays, apleys, thessalys, patellar apprehension. NV intact distally.   Assessment & Plan:  1. Left knee pain - Radiographs reviewed last visit with only mild arthritis - would expect injection and HEP to have helped her if this was cause of  her pain.  Concern she has worse arthritis than seen on radiographs or meniscus tear though exam for this reassuring today.  Discussed formal physical therapy vs MRI which she will consider.  F/u in 1 month otherwise.

## 2020-01-18 ENCOUNTER — Other Ambulatory Visit: Payer: Self-pay | Admitting: Family Medicine

## 2020-01-18 DIAGNOSIS — M8580 Other specified disorders of bone density and structure, unspecified site: Secondary | ICD-10-CM

## 2020-01-20 DIAGNOSIS — H401131 Primary open-angle glaucoma, bilateral, mild stage: Secondary | ICD-10-CM | POA: Diagnosis not present

## 2020-01-26 DIAGNOSIS — Z130 Encounter for screening for diseases of the blood and blood-forming organs and certain disorders involving the immune mechanism: Secondary | ICD-10-CM | POA: Diagnosis not present

## 2020-01-26 DIAGNOSIS — R8290 Unspecified abnormal findings in urine: Secondary | ICD-10-CM | POA: Diagnosis not present

## 2020-01-26 DIAGNOSIS — Z6829 Body mass index (BMI) 29.0-29.9, adult: Secondary | ICD-10-CM | POA: Diagnosis not present

## 2020-01-26 DIAGNOSIS — Z1389 Encounter for screening for other disorder: Secondary | ICD-10-CM | POA: Diagnosis not present

## 2020-01-26 DIAGNOSIS — Z01419 Encounter for gynecological examination (general) (routine) without abnormal findings: Secondary | ICD-10-CM | POA: Diagnosis not present

## 2020-01-26 DIAGNOSIS — E669 Obesity, unspecified: Secondary | ICD-10-CM | POA: Diagnosis not present

## 2020-01-26 DIAGNOSIS — Z124 Encounter for screening for malignant neoplasm of cervix: Secondary | ICD-10-CM | POA: Diagnosis not present

## 2020-01-26 DIAGNOSIS — Z1231 Encounter for screening mammogram for malignant neoplasm of breast: Secondary | ICD-10-CM | POA: Diagnosis not present

## 2020-02-16 ENCOUNTER — Ambulatory Visit: Payer: PPO | Admitting: Family Medicine

## 2020-03-06 DIAGNOSIS — H401131 Primary open-angle glaucoma, bilateral, mild stage: Secondary | ICD-10-CM | POA: Diagnosis not present

## 2020-03-08 ENCOUNTER — Encounter: Payer: Self-pay | Admitting: Family Medicine

## 2020-03-08 ENCOUNTER — Other Ambulatory Visit: Payer: Self-pay

## 2020-03-08 ENCOUNTER — Ambulatory Visit: Payer: PPO | Admitting: Family Medicine

## 2020-03-08 VITALS — BP 118/86 | Ht 68.5 in | Wt 185.0 lb

## 2020-03-08 DIAGNOSIS — M25562 Pain in left knee: Secondary | ICD-10-CM | POA: Diagnosis not present

## 2020-03-08 DIAGNOSIS — G8929 Other chronic pain: Secondary | ICD-10-CM

## 2020-03-08 NOTE — Progress Notes (Signed)
PCP: Glenis Smoker, MD  Subjective:   HPI: Patient is a 69 y.o. female here for follow-up on left knee pain.  Patient was seen 8/23 for knee pain.  She denied any injury or trauma.  Reported the pain had been going on since around January 2021.  Left knee pain continuously hurt and so she was seen and given CBD cream, Aspercreme, Voltaren gel.  She was also given a TENS unit.  She also received an knee injection.  Patient had follow-up visit on 10/11 where she reported that her knee pain had not improved at all after the knee injection.  She was going to continue with ice packs, taking Tylenol, aspirin, topical creams as well as taking boswellia.  Today the patient reports that her knee pain has completely resolved.  The swelling is also improved.  She reports that she continued with the ice packs nightly as well as taking the supplements and turmeric.  She has no complaints at this time.   No past medical history on file.  Current Outpatient Medications on File Prior to Visit  Medication Sig Dispense Refill   ALPHAGAN P 0.1 % SOLN   1   RESTASIS MULTIDOSE 0.05 % ophthalmic emulsion INT 1 GTT INTO OU BID  3   No current facility-administered medications on file prior to visit.    No past surgical history on file.  Allergies  Allergen Reactions   Bimatoprost    Brimonidine Tartrate-Timolol    Brinzolamide-Brimonidine    Codeine     Social History   Socioeconomic History   Marital status: Single    Spouse name: Not on file   Number of children: Not on file   Years of education: Not on file   Highest education level: Not on file  Occupational History   Not on file  Tobacco Use   Smoking status: Never Smoker   Smokeless tobacco: Never Used  Substance and Sexual Activity   Alcohol use: No   Drug use: Not on file   Sexual activity: Not on file  Other Topics Concern   Not on file  Social History Narrative   Not on file   Social Determinants  of Health   Financial Resource Strain:    Difficulty of Paying Living Expenses: Not on file  Food Insecurity:    Worried About [place] in the Last Year: Not on file   Ran Out of Food in the Last Year: Not on file  Transportation Needs:    Lack of Transportation (Medical): Not on file   Lack of Transportation (Non-Medical): Not on file  Physical Activity:    Days of Exercise per Week: Not on file   Minutes of Exercise per Session: Not on file  Stress:    Feeling of Stress : Not on file  Social Connections:    Frequency of Communication with Friends and Family: Not on file   Frequency of Social Gatherings with Friends and Family: Not on file   Attends Religious Services: Not on file   Active Member of Clubs or Organizations: Not on file   Attends Archivist Meetings: Not on file   Marital Status: Not on file  Intimate Partner Violence:    Fear of Current or Ex-Partner: Not on file   Emotionally Abused: Not on file   Physically Abused: Not on file   Sexually Abused: Not on file    No family history on file.  BP 118/86    Ht  5' 8.5" (1.74 m)    Wt 185 lb (83.9 kg)    BMI 27.72 kg/m   [place] Adult Exercise 11/29/2019 01/17/2020 03/08/2020  Frequency of aerobic exercise (# of days/week) 0 0 0  Average time in minutes 0 0 0  Frequency of strengthening activities (# of days/week) 0 0 0    No flowsheet data found.  Review of Systems: See HPI above.     Objective:  Physical Exam:  Gen: NAD, comfortable in exam room  Left knee exam No gross deformity, ecchymoses, swelling. No TTP. FROM. Negative ant/post drawers. Negative valgus/varus testing. Negative lachmans. Negative mcmurrays, apleys, patellar apprehension. NV intact distally.   Assessment & Plan:  Left knee pain Patient here for follow-up visit for left knee pain.  Left knee pain has resolved with ice and over-the-counter medications.  She did receive a knee  injection in August.  No concerns at this time.  She does question whether she can start walking again. -Patient is cleared to walk, encouraged to avoid large hills -Continue ice as needed -Continue over-the-counter medications as needed for pain -Follow-up as needed

## 2020-03-13 DIAGNOSIS — R7301 Impaired fasting glucose: Secondary | ICD-10-CM | POA: Diagnosis not present

## 2020-05-05 ENCOUNTER — Other Ambulatory Visit: Payer: PPO

## 2020-08-01 DIAGNOSIS — M545 Low back pain, unspecified: Secondary | ICD-10-CM | POA: Diagnosis not present

## 2020-09-15 ENCOUNTER — Other Ambulatory Visit: Payer: Self-pay

## 2020-09-15 ENCOUNTER — Ambulatory Visit
Admission: RE | Admit: 2020-09-15 | Discharge: 2020-09-15 | Disposition: A | Discharge status: Home / Self Care | Payer: PPO | Source: Ambulatory Visit | Attending: Family Medicine | Admitting: Family Medicine

## 2020-09-15 DIAGNOSIS — M8589 Other specified disorders of bone density and structure, multiple sites: Secondary | ICD-10-CM | POA: Diagnosis not present

## 2020-09-15 DIAGNOSIS — Z780 Asymptomatic menopausal state: Secondary | ICD-10-CM | POA: Diagnosis not present

## 2020-09-15 DIAGNOSIS — M8580 Other specified disorders of bone density and structure, unspecified site: Secondary | ICD-10-CM

## 2020-10-04 DIAGNOSIS — H401131 Primary open-angle glaucoma, bilateral, mild stage: Secondary | ICD-10-CM | POA: Diagnosis not present

## 2020-10-04 DIAGNOSIS — H401130 Primary open-angle glaucoma, bilateral, stage unspecified: Secondary | ICD-10-CM | POA: Diagnosis not present

## 2021-01-31 DIAGNOSIS — Z0000 Encounter for general adult medical examination without abnormal findings: Secondary | ICD-10-CM | POA: Diagnosis not present

## 2021-01-31 DIAGNOSIS — R7301 Impaired fasting glucose: Secondary | ICD-10-CM | POA: Diagnosis not present

## 2021-01-31 DIAGNOSIS — Z23 Encounter for immunization: Secondary | ICD-10-CM | POA: Diagnosis not present

## 2021-01-31 DIAGNOSIS — M8580 Other specified disorders of bone density and structure, unspecified site: Secondary | ICD-10-CM | POA: Diagnosis not present

## 2021-01-31 DIAGNOSIS — E7800 Pure hypercholesterolemia, unspecified: Secondary | ICD-10-CM | POA: Diagnosis not present

## 2021-01-31 DIAGNOSIS — Z1211 Encounter for screening for malignant neoplasm of colon: Secondary | ICD-10-CM | POA: Diagnosis not present

## 2021-02-05 DIAGNOSIS — Z124 Encounter for screening for malignant neoplasm of cervix: Secondary | ICD-10-CM | POA: Diagnosis not present

## 2021-02-05 DIAGNOSIS — Z01419 Encounter for gynecological examination (general) (routine) without abnormal findings: Secondary | ICD-10-CM | POA: Diagnosis not present

## 2021-02-05 DIAGNOSIS — Z1231 Encounter for screening mammogram for malignant neoplasm of breast: Secondary | ICD-10-CM | POA: Diagnosis not present

## 2021-02-05 DIAGNOSIS — N8110 Cystocele, unspecified: Secondary | ICD-10-CM | POA: Diagnosis not present

## 2021-02-07 DIAGNOSIS — Z1211 Encounter for screening for malignant neoplasm of colon: Secondary | ICD-10-CM | POA: Diagnosis not present

## 2021-02-12 ENCOUNTER — Other Ambulatory Visit: Payer: Self-pay | Admitting: Obstetrics and Gynecology

## 2021-02-12 DIAGNOSIS — R928 Other abnormal and inconclusive findings on diagnostic imaging of breast: Secondary | ICD-10-CM

## 2021-03-27 ENCOUNTER — Ambulatory Visit
Admission: RE | Admit: 2021-03-27 | Discharge: 2021-03-27 | Disposition: A | Discharge status: Home / Self Care | Payer: PPO | Source: Ambulatory Visit | Attending: Obstetrics and Gynecology | Admitting: Obstetrics and Gynecology

## 2021-03-27 ENCOUNTER — Ambulatory Visit: Payer: PPO

## 2021-03-27 ENCOUNTER — Other Ambulatory Visit: Payer: Self-pay | Admitting: Obstetrics and Gynecology

## 2021-03-27 DIAGNOSIS — R928 Other abnormal and inconclusive findings on diagnostic imaging of breast: Secondary | ICD-10-CM

## 2021-03-27 DIAGNOSIS — R922 Inconclusive mammogram: Secondary | ICD-10-CM | POA: Diagnosis not present

## 2021-03-27 DIAGNOSIS — N6320 Unspecified lump in the left breast, unspecified quadrant: Secondary | ICD-10-CM

## 2021-04-10 ENCOUNTER — Other Ambulatory Visit: Payer: Self-pay

## 2021-04-10 ENCOUNTER — Ambulatory Visit
Admission: RE | Admit: 2021-04-10 | Discharge: 2021-04-10 | Disposition: A | Discharge status: Home / Self Care | Payer: PPO | Source: Ambulatory Visit | Attending: Obstetrics and Gynecology | Admitting: Obstetrics and Gynecology

## 2021-04-10 DIAGNOSIS — N6320 Unspecified lump in the left breast, unspecified quadrant: Secondary | ICD-10-CM

## 2021-04-10 DIAGNOSIS — N6321 Unspecified lump in the left breast, upper outer quadrant: Secondary | ICD-10-CM | POA: Diagnosis not present

## 2021-04-11 ENCOUNTER — Other Ambulatory Visit: Payer: Self-pay | Admitting: Obstetrics and Gynecology

## 2021-04-11 DIAGNOSIS — R928 Other abnormal and inconclusive findings on diagnostic imaging of breast: Secondary | ICD-10-CM

## 2021-04-11 DIAGNOSIS — H401131 Primary open-angle glaucoma, bilateral, mild stage: Secondary | ICD-10-CM | POA: Diagnosis not present

## 2021-04-25 ENCOUNTER — Other Ambulatory Visit: Payer: Self-pay | Admitting: Obstetrics and Gynecology

## 2021-04-25 ENCOUNTER — Ambulatory Visit
Admission: RE | Admit: 2021-04-25 | Discharge: 2021-04-25 | Disposition: A | Discharge status: Home / Self Care | Payer: PPO | Source: Ambulatory Visit | Attending: Obstetrics and Gynecology | Admitting: Obstetrics and Gynecology

## 2021-04-25 ENCOUNTER — Ambulatory Visit: Payer: PPO

## 2021-04-25 DIAGNOSIS — R928 Other abnormal and inconclusive findings on diagnostic imaging of breast: Secondary | ICD-10-CM

## 2021-04-25 DIAGNOSIS — C50412 Malignant neoplasm of upper-outer quadrant of left female breast: Secondary | ICD-10-CM | POA: Diagnosis not present

## 2021-04-30 ENCOUNTER — Telehealth: Payer: Self-pay | Admitting: Hematology and Oncology

## 2021-04-30 NOTE — Telephone Encounter (Signed)
Spoke to patient to confirm afternoon clinic appointment for 2/1, packet will be sent via mail

## 2021-05-02 DIAGNOSIS — H401131 Primary open-angle glaucoma, bilateral, mild stage: Secondary | ICD-10-CM | POA: Diagnosis not present

## 2021-05-07 ENCOUNTER — Encounter: Payer: Self-pay | Admitting: *Deleted

## 2021-05-07 DIAGNOSIS — C50412 Malignant neoplasm of upper-outer quadrant of left female breast: Secondary | ICD-10-CM | POA: Insufficient documentation

## 2021-05-07 DIAGNOSIS — Z170 Estrogen receptor positive status [ER+]: Secondary | ICD-10-CM

## 2021-05-07 NOTE — Progress Notes (Signed)
Radiation Oncology         [phone] ________________________________  Name: Loretta Griffin        MRN: [id number]  Date of Service: 05/09/2021 DOB: 1951-03-25  WU:[id number], Anastasia Pall, MD  Jovita Kussmaul, MD     REFERRING PHYSICIAN: Autumn Messing III, MD   DIAGNOSIS: The encounter diagnosis was Malignant neoplasm of upper-outer quadrant of left breast in female, estrogen receptor positive ([place]).   HISTORY OF PRESENT ILLNESS: Loretta Griffin is a 71 y.o. female seen in the multidisciplinary breast clinic for a new diagnosis of left breast cancer. The patient was noted to have screening detected abnormality in the left breast.  By diagnostic ultrasound this was located in the upper outer quadrant in the 2 o'clock position.  It measured up to 7 mm in greatest dimension and her axilla on the left was negative for adenopathy.  Further imaging felt that this was actually smaller and possibly up to 3 mm in greatest dimension rather than 7.  Initially her biopsy of the lesion was benign however it was not felt that this was an adequate sample and she subsequently underwent stereotactic biopsy of the lesion which showed a grade 1 invasive ductal carcinoma that was ER/PR positive, HER2 negative with a Ki-67 of 1%.  She is seen today to discuss treatment recommendations of her cancer.Marland Kitchen    PREVIOUS RADIATION THERAPY: No   PAST MEDICAL HISTORY: No past medical history on file.     PAST SURGICAL HISTORY:No past surgical history on file.   FAMILY HISTORY: No family history on file.   SOCIAL HISTORY:  reports that she has never smoked. She has never used smokeless tobacco. She reports that she does not drink alcohol.  The patient is single and lives in [place].  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     REVIEW OF SYSTEMS: On review of systems, the patient reports that she is doing well overall. She is contemplating all of her surgical options, and is open to hear the recommendations she has to choose from.      PHYSICAL EXAM:  Wt Readings from Last 3 Encounters:  05/09/21 194 lb 8 oz (88.2 kg)  03/08/20 185 lb (83.9 kg)  01/17/20 185 lb (83.9 kg)   Temp Readings from Last 3 Encounters:  05/09/21 97.7 F (36.5 C) (Temporal)   BP Readings from Last 3 Encounters:  05/09/21 (!) 145/78  03/08/20 118/86  01/17/20 118/78   Pulse Readings from Last 3 Encounters:  05/09/21 70  10/16/16 66    In general this is a well appearing Caucasian female in no acute distress. She's alert and oriented x4 and appropriate throughout the examination. Cardiopulmonary assessment is negative for acute distress and she exhibits normal effort. Bilateral breast exam is deferred.    ECOG = 0  0 - Asymptomatic (Fully active, able to carry on all predisease activities without restriction)  1 - Symptomatic but completely ambulatory (Restricted in physically strenuous activity but ambulatory and able to carry out work of  a light or sedentary nature. For example, light housework, office work)  2 - Symptomatic, <50% in bed during the day (Ambulatory and capable of all self care but unable to carry out any work activities. Up and about more than 50% of waking hours)  3 - Symptomatic, >50% in bed, but not bedbound (Capable of only limited self-care, confined to bed or chair 50% or more of waking hours)  4 - Bedbound (Completely disabled. Cannot carry on any self-care. Totally confined to bed or chair)  5 - Death   Eustace Pen MM, Creech RH,  Tormey DC, et al. [phone]). "Toxicity and response criteria of the [facility]". [place] Oncol. 5 (6): 649-55    LABORATORY DATA:  Lab Results  Component Value Date   WBC 5.9 05/09/2021   HGB 13.6 05/09/2021   HCT 41.8 05/09/2021   MCV 89.5 05/09/2021   PLT 253 05/09/2021   Lab Results  Component Value Date   NA 140 05/09/2021   K 4.3 05/09/2021   CL 108 05/09/2021   CO2 26 05/09/2021   Lab Results  Component Value Date   ALT 10 05/09/2021   AST 12 (L) 05/09/2021   ALKPHOS 115 05/09/2021   BILITOT 0.3 05/09/2021      RADIOGRAPHY: MM CLIP PLACEMENT LEFT  Result Date: 04/25/2021 CLINICAL DATA:  Status post stereotactic biopsy the left breast. EXAM: 3D DIAGNOSTIC LEFT MAMMOGRAM POST ULTRASOUND BIOPSY COMPARISON:  Previous exam(s). FINDINGS: 3D Mammographic images were obtained following stereotactic guided biopsy of the left breast. The biopsy marking clip is in the upper-outer quadrant of the left breast. IMPRESSION: Appropriate positioning of the X shaped biopsy marking clip at the site of biopsy in the upper-outer quadrant of the left breast. Final Assessment: Post Procedure Mammograms for Marker Placement Electronically Signed   By: Lillia Mountain M.D.   On: 04/25/2021 12:38  MM CLIP PLACEMENT LEFT  Result Date: 04/10/2021 CLINICAL DATA:  Status post left breast ultrasound-guided biopsy. EXAM: 3D DIAGNOSTIC LEFT MAMMOGRAM POST ULTRASOUND BIOPSY COMPARISON:  Previous exam(s). FINDINGS: 3D Mammographic images were obtained following ultrasound guided biopsy of the left breast. The biopsy marking clip is identified in the upper outer left breast. This does not correspond with the mammographically identified mass on the patient's most recent screening mammogram. IMPRESSION: Post biopsy clip does not correspond with the mammographically identified mass. A final recommendation will be made once pathology is available. Final Assessment: Post Procedure Mammograms for  Marker Placement Electronically Signed   By: Kristopher Oppenheim M.D.   On: 04/10/2021 13:57  MM LT BREAST BX W LOC DEV 1ST LESION IMAGE BX SPEC STEREO GUIDE  Addendum Date: 04/30/2021   ADDENDUM REPORT: 04/30/2021 08:09 ADDENDUM: Pathology revealed GRADE I INVASIVE DUCTAL CARCINOMA, LOW-GRADE DCIS of the LEFT breast, upper outer quadrant, (x clip). This was found to be concordant by Dr. Lillia Mountain. Pathology results were discussed with the patient by telephone. The patient reported doing well after the biopsy with tenderness at the site. Post biopsy instructions and care were reviewed and questions were answered. The patient was encouraged to call [facility] for any additional concerns. My direct phone number was provided. The patient was referred to [facility] on May 09, 2021, per patient request. Pathology results reported by Terie Purser, RN on 04/27/2021. Electronically Signed   By: Lillia Mountain M.D.   On: 04/30/2021 08:09   Addendum Date:  04/25/2021   ADDENDUM REPORT: 04/25/2021 12:39 ADDENDUM: This is an addendum to a left breast stereotactic biopsy performed on 04/25/2021. In the reported states a coil shaped clip was deployed into the biopsy cavity. This is incorrect. An X shaped clip was placed into the biopsy cavity in the upper-outer quadrant of the left breast. Electronically Signed   By: Lillia Mountain M.D.   On: 04/25/2021 12:39   Result Date: 04/30/2021 CLINICAL DATA:  Indeterminate left breast mass. EXAM: LEFT BREAST STEREOTACTIC CORE NEEDLE BIOPSY COMPARISON:  Previous exams. FINDINGS: The patient and I discussed the procedure of stereotactic-guided biopsy including benefits and alternatives. We discussed the high likelihood of a successful procedure. We discussed the risks of the procedure including infection, bleeding, tissue injury, clip migration, and inadequate sampling. Informed written  consent was given. The usual time out protocol was performed immediately prior to the procedure. Using sterile technique and 1% lidocaine and 1% lidocaine with epinephrine as local anesthetic, under stereotactic guidance, a 9 gauge vacuum assisted device was used to perform core needle biopsy of a mass in the upper-outer quadrant of the left breast using a lateral to medial approach. Lesion quadrant: Upper-outer quadrant At the conclusion of the procedure, coil shaped tissue marker clip was deployed into the biopsy cavity. Follow-up 2-view mammogram was performed and dictated separately. IMPRESSION: Stereotactic-guided biopsy of the left breast. No apparent complications. Electronically Signed: By: Lillia Mountain M.D. On: 04/25/2021 12:21  [place] LT BREAST BX W LOC DEV 1ST LESION IMG BX SPEC US GUIDE  Addendum Date: 04/16/2021   ADDENDUM REPORT: 04/16/2021 11:20 ADDENDUM: Pathology revealed FIBROADENOMATOID NODULE of the LEFT breast, 2:00 o'clock, 7 cmfn. This was found to be concordant by Dr. Kristopher Oppenheim. Pathology results were discussed with the patient by telephone. The patient reported doing well after the biopsy with tenderness at the site. Post biopsy instructions and care were reviewed and questions were answered. The patient was encouraged to call [facility] for any additional concerns. My direct phone number was provided. The patient is scheduled for a LEFT breast stereotatic guided biopsy on April 25, 2021. Further recommendations will be guided by the results of this biopsy. Pathology results reported by Terie Purser, RN on 04/12/2021. Electronically Signed   By: Kristopher Oppenheim M.D.   On: 04/16/2021 11:20   Result Date: 04/16/2021 CLINICAL DATA:  71 year old female with a suspicious left breast mass. EXAM: ULTRASOUND GUIDED LEFT BREAST CORE NEEDLE BIOPSY COMPARISON:  Previous exam(s). PROCEDURE: I met with the patient and we discussed the procedure of ultrasound-guided biopsy,  including benefits and alternatives. We discussed the high likelihood of a successful procedure. We discussed the risks of the procedure, including infection, bleeding, tissue injury, clip migration, and inadequate sampling. Informed written consent was given. The usual time-out protocol was performed immediately prior to the procedure. Lesion quadrant: Upper outer quadrant Using sterile technique and 1% Lidocaine as local anesthetic, under direct ultrasound visualization, a 14 gauge spring-loaded device was used to perform biopsy of a mass at the 2 o'clock position 7 cm from the nipple using a lateral approach. At the conclusion of the procedure a ribbon shaped tissue marker clip was deployed into the biopsy cavity. Follow up 2 view mammogram was performed and dictated separately. IMPRESSION: Ultrasound guided biopsy of the left breast. No apparent complications. Electronically Signed: By: Kristopher Oppenheim M.D. On: 04/10/2021 13:57      IMPRESSION/PLAN: 1. Stage IA, cT1aN0M0, grade 1, ER/PR positive invasive ductal carcinoma  of the left breast. Dr. Lisbeth Renshaw discusses the pathology findings and reviews the nature of left breast disease. The consensus from the breast conference includes breast conservation with lumpectomy. Dr. Lisbeth Renshaw discusses the rationale for external radiotherapy to the breast  to reduce risks of local recurrence followed by antiestrogen therapy. Dr. Lisbeth Renshaw also discusses cases in which radiation may be optional for favorable cases based on final pathology. We discussed the risks, benefits, short, and long term effects of radiotherapy, as well as the curative intent, and the patient is considering her options, initially she was thinking bilateral mastectomies but is more open to considering breast conserving surgery. Dr. Lisbeth Renshaw discusses the delivery and logistics of radiotherapy and anticipates a course of 6 1/2 weeks of radiotherapy to the left breast with deep inspiration breath-hold technique due  to in situ implants. We will see her back a few weeks after surgery to discuss the simulation process and anticipate we starting radiotherapy about 4-6 weeks after surgery.  2. Possible genetic predisposition to malignancy. The patient is a candidate for genetic testing given her personal and family history. She was offered referral and agreed to meet with genetics in clinic today.   In a visit lasting 60 minutes, greater than 50% of the time was spent face to face reviewing her case, as well as in preparation of, discussing, and coordinating the patient's care.  The above documentation reflects my direct findings during this shared patient visit. Please see the separate note by Dr. Lisbeth Renshaw on this date for the remainder of the patient's plan of care.    Carola Rhine, [facility]    **Disclaimer: This note was dictated with voice recognition software. Similar sounding words can inadvertently be transcribed and this note may contain transcription errors which may not have been corrected upon publication of note.**

## 2021-05-08 NOTE — Progress Notes (Signed)
[facility] CONSULT NOTE  Patient Care Team: Glenis Smoker, MD as PCP - General (Family Medicine) Jovita Kussmaul, MD as Consulting Physician (General Surgery) Nicholas Lose, MD as Consulting Physician (Hematology and Oncology) Kyung Rudd, MD as Consulting Physician (Radiation Oncology) Mauro Kaufmann, RN as Oncology Nurse Navigator Rockwell Germany, RN as Oncology Nurse Navigator  CHIEF COMPLAINTS/PURPOSE OF CONSULTATION:  Newly diagnosed left breast cancer  HISTORY OF PRESENTING ILLNESS:  Loretta Griffin 71 y.o. female is here because of recent diagnosis of invasive ductal carcinoma of the left breast. Left breast biopsy on 04/25/2021 showed grade 1 invasive ductal carcinoma with low grade DCIS, ER/PR+(100%)/Her2-. She presents to the clinic today for initial evaluation and discussion of treatment options.   I reviewed her records extensively and collaborated the history with the patient.  SUMMARY OF ONCOLOGIC HISTORY: Oncology History  Malignant neoplasm of upper-outer quadrant of left breast in female, estrogen receptor positive ([place])  04/25/2021 Initial Diagnosis   Screening mammogram detected left breast mass UOQ 2:00: 0.7 cm: Ultrasound biopsy: Benign concordant; additional 0.3 cm distortion: Stereotactic biopsy: Grade 1 IDC ER 100%, PR 100%, HER2 negative, Ki-67 1%   05/09/2021 Cancer Staging   Staging form: Breast, AJCC 8th Edition - Clinical stage from 05/09/2021: Stage IA (cT1a, cN0, cM0, G1, ER+, PR+, HER2-) - Signed by Nicholas Lose, MD on 05/09/2021 Stage prefix: Initial diagnosis Histologic grading system: 3 grade system      MEDICAL HISTORY:  Past Medical History:  Diagnosis Date   Breast cancer ([place])     SURGICAL HISTORY: Past Surgical History:  Procedure Laterality Date   cataract surgery     eye lid lift     left knee arthroscopy     left lumpectomy     PLACEMENT OF BREAST IMPLANTS      SOCIAL HISTORY: Social History    Socioeconomic History   Marital status: Single    Spouse name: Not on file   Number of children: Not on file   Years of education: Not on file   Highest education level: Not on file  Occupational History   Not on file  Tobacco Use   Smoking status: Never   Smokeless tobacco: Never  Substance and Sexual Activity   Alcohol use: No   Drug use: Never   Sexual activity: Not on file  Other Topics Concern   Not on file  Social History Narrative   Not on file   Social Determinants of Health   Financial Resource Strain: Not on file  Food Insecurity: Not on file  Transportation Needs: Not on file  Physical Activity: Not on file  Stress: Not on file  Social Connections: Not on file  Intimate Partner Violence: Not on file    FAMILY HISTORY: Family History  Problem Relation Age of Onset   Lung cancer Maternal Uncle    Breast cancer Paternal Aunt     ALLERGIES:  is allergic to bimatoprost, brimonidine tartrate-timolol, brinzolamide-brimonidine, codeine, and rhopressa [netarsudil dimesylate].  MEDICATIONS:  Current Outpatient Medications  Medication Sig Dispense Refill   ALPHAGAN P 0.1 % SOLN   1   Ascorbic Acid (VITAMIN C) 1000 MG tablet 1 tablet     Boswellia Serrata Extract POWD 500 mg by Does not apply route daily.     Cholecalciferol (D3) 50 MCG (2000 UT) TABS Take by mouth.     diphenhydrAMINE (BENADRYL) 25 mg capsule Take 25 mg by mouth every 6 (six) hours as needed.  Glucosamine-Chondroit-Vit C-Mn (GLUCOSAMINE 1500 COMPLEX PO) Take by mouth.     Multiple Vitamins-Minerals (PRESERVISION AREDS 2+MULTI VIT PO) Take by mouth.     Omega-3 Fatty Acids (FISH OIL) 1000 MG CAPS Take 1 capsule by mouth daily.     RESTASIS MULTIDOSE 0.05 % ophthalmic emulsion INT 1 GTT INTO OU BID  3   Turmeric (QC TUMERIC COMPLEX) 500 MG CAPS Take 1,500 mg by mouth daily.     vitamin B-12 (CYANOCOBALAMIN) 500 MCG tablet Take 5,000 mcg by mouth daily.     vitamin E 180 MG (400 UNITS)  capsule Take 400 Units by mouth daily.     No current facility-administered medications for this visit.    REVIEW OF SYSTEMS:   Constitutional: Denies fevers, chills or abnormal night sweats Eyes: Denies blurriness of vision, double vision or watery eyes Ears, nose, mouth, throat, and face: Denies mucositis or sore throat Respiratory: Denies cough, dyspnea or wheezes Cardiovascular: Denies palpitation, chest discomfort or lower extremity swelling Gastrointestinal:  Denies nausea, heartburn or change in bowel habits Skin: Denies abnormal skin rashes Lymphatics: Denies new lymphadenopathy or easy bruising Neurological:Denies numbness, tingling or new weaknesses Behavioral/Psych: Mood is stable, no new changes  Breast:  Denies any palpable lumps or discharge All other systems were reviewed with the patient and are negative.  PHYSICAL EXAMINATION: ECOG PERFORMANCE STATUS: 1 - Symptomatic but completely ambulatory  Vitals:   05/09/21 1248  BP: (!) 145/78  Pulse: 70  Resp: 16  Temp: 97.7 F (36.5 C)  SpO2: 96%   Filed Weights   05/09/21 1248  Weight: 194 lb 8 oz (88.2 kg)      LABORATORY DATA:  I have reviewed the data as listed Lab Results  Component Value Date   WBC 5.9 05/09/2021   HGB 13.6 05/09/2021   HCT 41.8 05/09/2021   MCV 89.5 05/09/2021   PLT 253 05/09/2021   Lab Results  Component Value Date   NA 140 05/09/2021   K 4.3 05/09/2021   CL 108 05/09/2021   CO2 26 05/09/2021    RADIOGRAPHIC STUDIES: I have personally reviewed the radiological reports and agreed with the findings in the report.  ASSESSMENT AND PLAN:  Malignant neoplasm of upper-outer quadrant of left breast in female, estrogen receptor positive ([place]) 04/25/2021:Screening mammogram detected left breast mass UOQ 2:00: 0.7 cm: Ultrasound biopsy: Benign concordant; additional 0.3 cm distortion: Stereotactic biopsy: Grade 1 IDC ER 100%, PR 100%, HER2 negative, Ki-67 1%  Pathology and radiology  counseling:Discussed with the patient, the details of pathology including the type of breast cancer,the clinical staging, the significance of ER, PR and HER-2/neu receptors and the implications for treatment. After reviewing the pathology in detail, we proceeded to discuss the different treatment options between surgery, radiation, and antiestrogen therapies.  Recommendations: 1. Breast conserving surgery followed by 2. Adjuvant radiation therapy followed by 3. Adjuvant antiestrogen therapy with letrozole x5 years  Patient is not very keen on either radiation or antiestrogen therapy. She had even discussed the role of bilateral mastectomies. 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  Return to clinic after surgery to discuss final pathology report    All questions were answered. The patient knows to call the clinic with any problems, questions or concerns.   Rulon Eisenmenger, MD, MPH 05/09/2021    I, Thana Ates, am acting as scribe for Nicholas Lose, MD.  I have reviewed the above documentation for accuracy and completeness, and I  agree with the above.

## 2021-05-09 ENCOUNTER — Other Ambulatory Visit: Payer: Self-pay

## 2021-05-09 ENCOUNTER — Ambulatory Visit (HOSPITAL_BASED_OUTPATIENT_CLINIC_OR_DEPARTMENT_OTHER): Payer: PPO | Admitting: Genetic Counselor

## 2021-05-09 ENCOUNTER — Encounter: Payer: Self-pay | Admitting: General Practice

## 2021-05-09 ENCOUNTER — Inpatient Hospital Stay: Payer: PPO | Attending: Hematology and Oncology

## 2021-05-09 ENCOUNTER — Encounter: Payer: Self-pay | Admitting: Hematology and Oncology

## 2021-05-09 ENCOUNTER — Ambulatory Visit: Payer: Self-pay | Admitting: General Surgery

## 2021-05-09 ENCOUNTER — Ambulatory Visit
Admission: RE | Admit: 2021-05-09 | Discharge: 2021-05-09 | Disposition: A | Discharge status: Home / Self Care | Payer: PPO | Source: Ambulatory Visit | Attending: Radiation Oncology | Admitting: Radiation Oncology

## 2021-05-09 ENCOUNTER — Inpatient Hospital Stay (HOSPITAL_BASED_OUTPATIENT_CLINIC_OR_DEPARTMENT_OTHER): Payer: PPO | Admitting: Hematology and Oncology

## 2021-05-09 ENCOUNTER — Ambulatory Visit: Payer: PPO | Admitting: Physical Therapy

## 2021-05-09 DIAGNOSIS — Z803 Family history of malignant neoplasm of breast: Secondary | ICD-10-CM

## 2021-05-09 DIAGNOSIS — C50412 Malignant neoplasm of upper-outer quadrant of left female breast: Secondary | ICD-10-CM

## 2021-05-09 DIAGNOSIS — Z801 Family history of malignant neoplasm of trachea, bronchus and lung: Secondary | ICD-10-CM | POA: Diagnosis not present

## 2021-05-09 DIAGNOSIS — Z170 Estrogen receptor positive status [ER+]: Secondary | ICD-10-CM

## 2021-05-09 LAB — CBC WITH DIFFERENTIAL (CANCER CENTER ONLY)
Abs Immature Granulocytes: 0.01 10*3/uL (ref 0.00–0.07)
Basophils Absolute: 0 10*3/uL (ref 0.0–0.1)
Basophils Relative: 1 %
Eosinophils Absolute: 0.1 10*3/uL (ref 0.0–0.5)
Eosinophils Relative: 2 %
HCT: 41.8 % (ref 36.0–46.0)
Hemoglobin: 13.6 g/dL (ref 12.0–15.0)
Immature Granulocytes: 0 %
Lymphocytes Relative: 27 %
Lymphs Abs: 1.6 10*3/uL (ref 0.7–4.0)
MCH: 29.1 pg (ref 26.0–34.0)
MCHC: 32.5 g/dL (ref 30.0–36.0)
MCV: 89.5 fL (ref 80.0–100.0)
Monocytes Absolute: 0.4 10*3/uL (ref 0.1–1.0)
Monocytes Relative: 7 %
Neutro Abs: 3.7 10*3/uL (ref 1.7–7.7)
Neutrophils Relative %: 63 %
Platelet Count: 253 10*3/uL (ref 150–400)
RBC: 4.67 MIL/uL (ref 3.87–5.11)
RDW: 14.1 % (ref 11.5–15.5)
WBC Count: 5.9 10*3/uL (ref 4.0–10.5)
nRBC: 0 % (ref 0.0–0.2)

## 2021-05-09 LAB — CMP (CANCER CENTER ONLY)
ALT: 10 U/L (ref 0–44)
AST: 12 U/L — ABNORMAL LOW (ref 15–41)
Albumin: 4.3 g/dL (ref 3.5–5.0)
Alkaline Phosphatase: 115 U/L (ref 38–126)
Anion gap: 6 (ref 5–15)
BUN: 20 mg/dL (ref 8–23)
CO2: 26 mmol/L (ref 22–32)
Calcium: 10.4 mg/dL — ABNORMAL HIGH (ref 8.9–10.3)
Chloride: 108 mmol/L (ref 98–111)
Creatinine: 0.9 mg/dL (ref 0.44–1.00)
GFR, Estimated: >60 mL/min (ref >60)
Glucose, Bld: 98 mg/dL (ref 70–99)
Potassium: 4.3 mmol/L (ref 3.5–5.1)
Sodium: 140 mmol/L (ref 135–145)
Total Bilirubin: 0.3 mg/dL (ref 0.3–1.2)
Total Protein: 7.3 g/dL (ref 6.5–8.1)

## 2021-05-09 LAB — GENETIC SCREENING ORDER

## 2021-05-09 NOTE — Assessment & Plan Note (Signed)
04/25/2021:Screening mammogram detected left breast mass UOQ 2:00: 0.7 cm: Ultrasound biopsy: Benign concordant; additional 0.3 cm distortion: Stereotactic biopsy: Grade 1 IDC ER 100%, PR 100%, HER2 negative, Ki-67 1%  Pathology and radiology counseling:Discussed with the patient, the details of pathology including the type of breast cancer,the clinical staging, the significance of ER, PR and HER-2/neu receptors and the implications for treatment. After reviewing the pathology in detail, we proceeded to discuss the different treatment options between surgery, radiation, and antiestrogen therapies.  Recommendations: 1. Breast conserving surgery followed by 2. Adjuvant radiation therapy followed by 3. Adjuvant antiestrogen therapy  Return to clinic after surgery to discuss final pathology report

## 2021-05-09 NOTE — Progress Notes (Signed)
[place] Psychosocial Distress Screening Spiritual Care  Met with Vernon in [facility] to introduce [place] team/resources, reviewing distress screen per protocol.  The patient scored a 2 on the Psychosocial Distress Thermometer which indicates mild distress. Also assessed for distress and other psychosocial needs.   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 She reports low distress, good support from friends, and very positive experience with [facility] team. She appreciates having autonomy in decision-making about her care and values Spiritual Care as part of her support team, especially thanks to her mother's experience in hospice care. Provided empathic listening, emotional support, and introduction to [facility] support programming.   Follow up needed: Yes.  We plan to follow up by phone in ca 2 weeks for pastoral check-in.   [place], [place], [facility] Pager [phone] Voicemail [phone]

## 2021-05-10 ENCOUNTER — Encounter: Payer: Self-pay | Admitting: Genetic Counselor

## 2021-05-10 DIAGNOSIS — Z803 Family history of malignant neoplasm of breast: Secondary | ICD-10-CM

## 2021-05-10 DIAGNOSIS — H401131 Primary open-angle glaucoma, bilateral, mild stage: Secondary | ICD-10-CM | POA: Diagnosis not present

## 2021-05-10 HISTORY — DX: Family history of malignant neoplasm of breast: Z80.3

## 2021-05-10 NOTE — Progress Notes (Signed)
REFERRING PROVIDER: Nicholas Lose, MD [address] [place],  [place] [address]  PRIMARY PROVIDER:  Glenis Smoker, MD  PRIMARY REASON FOR VISIT:  1. Malignant neoplasm of upper-outer quadrant of left breast in female, estrogen receptor positive ([place])   2. Family history of breast cancer     HISTORY OF PRESENT ILLNESS:   Loretta Griffin, a 71 y.o. female, was seen for a [place] cancer genetics consultation during the breast multidisciplinary clinic at the request of Dr. Lindi Adie due to a personal and family history of cancer.  Ms. Siess presents to clinic today to discuss the possibility of a hereditary predisposition to cancer, to discuss genetic testing, and to further clarify her future cancer risks, as well as potential cancer risks for family members.   In January 2023, at the age of 20, Ms. Thier was diagnosed with invasive ductal carcinoma of the left breast (ER+/PR+/HER2-). The preliminary treatment plan includes breast conserving surgery, adjuvant radiation, and anti-estrogens.   CANCER HISTORY:  Oncology History  Malignant neoplasm of upper-outer quadrant of left breast in female, estrogen receptor positive ([place])  04/25/2021 Initial Diagnosis   Screening mammogram detected left breast mass UOQ 2:00: 0.7 cm: Ultrasound biopsy: Benign concordant; additional 0.3 cm distortion: Stereotactic biopsy: Grade 1 IDC ER 100%, PR 100%, HER2 negative, Ki-67 1%   05/09/2021 Cancer Staging   Staging form: Breast, AJCC 8th Edition - Clinical stage from 05/09/2021: Stage IA (cT1a, cN0, cM0, G1, ER+, PR+, HER2-) - Signed by Nicholas Lose, MD on 05/09/2021 Stage prefix: Initial diagnosis Histologic grading system: 3 grade system       Past Medical History:  Diagnosis Date   Breast cancer ([place])    Family history of breast cancer 05/10/2021    Past Surgical History:  Procedure Laterality Date   cataract surgery     eye lid lift     left knee arthroscopy     left  lumpectomy     PLACEMENT OF BREAST IMPLANTS      FAMILY HISTORY:  We obtained a detailed, 4-generation family history.  Significant diagnoses are listed below: Family History  Problem Relation Age of Onset   Breast cancer Mother        dx 86; dx 40; two primaries; mets   Lung cancer Maternal Uncle        dx after 24; smoking hx   Breast cancer Paternal Aunt        dx 66s    Ms. Valbuena is unaware of previous family history of genetic testing for hereditary cancer risks. There is no reported Ashkenazi Jewish ancestry. There is no known consanguinity.  GENETIC COUNSELING ASSESSMENT: Ms. Alessandrini is a 71 y.o. female with a personal and family history of cancer which is somewhat suggestive of a hereditary cancer syndrome and predisposition to cancer given the presence of related cancers in her family. We, therefore, discussed and recommended the following at today's visit.   DISCUSSION: We discussed that 5 - 10% of cancer is hereditary, with most cases of hereditary breast cancer associated with mutations in BRCA1/2.  There are other genes that can be associated with hereditary breast cancer syndromes.  Type of cancer risk and level of risk are gene-specific. We discussed that testing is beneficial for several reasons including knowing how to follow individuals after completing their treatment, identifying whether potential treatment options would be beneficial, and understanding if other family members could be at risk for cancer and allowing them to undergo genetic testing.  We reviewed the characteristics, features and inheritance patterns of hereditary cancer syndromes. We also discussed genetic testing, including the appropriate family members to test, the process of testing, insurance coverage and turn-around-time for results. We discussed the implications of a negative, positive and/or variant of uncertain significant result. In order to get genetic test results in a timely manner so that  Ms. Lai can use these genetic test results for surgical decisions, we recommended Ms. Shivley pursue genetic testing for the [employer].  The BRCAplus panel offered by [employer] and includes sequencing and deletion/duplication analysis for the following 8 genes: ATM, BRCA1, BRCA2, CDH1, CHEK2, PALB2, PTEN, and TP53. Once complete, we recommend Ms. Nuno pursue reflex genetic testing to a more comprehensive gene panel.   Ms. Vogelgesang  was offered a common hereditary cancer panel (47 genes) and an expanded pan-cancer panel (77 genes). Ms. Liscano was informed of the benefits and limitations of each panel, including that expanded pan-cancer panels contain genes that do not have clear management guidelines at this point in time.  We also discussed that as the number of genes included on a panel increases, the chances of variants of uncertain significance increases.  After considering the benefits and limitations of each gene panel, Ms. Nease  elected to have an expanded pan-cancer panel through [place].  The CancerNext-Expanded gene panel offered by [facility] and includes sequencing, rearrangement, and RNA analysis for the following 77 genes: AIP, ALK, APC, ATM, AXIN2, BAP1, BARD1, BLM, BMPR1A, BRCA1, BRCA2, BRIP1, CDC73, CDH1, CDK4, CDKN1B, CDKN2A, CHEK2, CTNNA1, DICER1, FANCC, FH, FLCN, GALNT12, KIF1B, LZTR1, MAX, MEN1, MET, MLH1, MSH2, MSH3, MSH6, MUTYH, NBN, NF1, NF2, NTHL1, PALB2, PHOX2B, PMS2, POT1, PRKAR1A, PTCH1, PTEN, RAD51C, RAD51D, RB1, RECQL, RET, SDHA, SDHAF2, SDHB, SDHC, SDHD, SMAD4, SMARCA4, SMARCB1, SMARCE1, STK11, SUFU, TMEM127, TP53, TSC1, TSC2, VHL and XRCC2 (sequencing and deletion/duplication); EGFR, EGLN1, HOXB13, KIT, MITF, PDGFRA, POLD1, and POLE (sequencing only); EPCAM and GREM1 (deletion/duplication only).   Based on Ms. Schurman's personal and family history of breast cancer, she meets medical criteria for genetic testing. Despite that she meets criteria, she may  still have an out of pocket cost. We discussed that if her out of pocket cost for testing is over $100, the laboratory should contact them to discuss self-pay prices, patient pay assistance programs, if applicable, and other billing options.   PLAN: After considering the risks, benefits, and limitations, Ms. Diles provided informed consent to pursue genetic testing and the blood sample was sent to [facility] for analysis of the [facility] and CancerNext-Expanded +RNAinsight Panel. Results should be available within approximately 1-2 weeks' time, at which point they will be disclosed by telephone to Ms. Boan, as will any additional recommendations warranted by these results. Ms. Gullikson will receive a summary of her genetic counseling visit and a copy of her results once available. This information will also be available in Epic.   Lastly, we encouraged Ms. Cuadros to remain in contact with cancer genetics annually so that we can continuously update the family history and inform her of any changes in cancer genetics and testing that may be of benefit for this family.   Ms. Bown questions were answered to her satisfaction today. Our contact information was provided should additional questions or concerns arise. Thank you for the referral and allowing [place] to share in the care of your patient.   Shakara Tweedy M. Joette Catching, [place], [facility] Genetic Counselor [email or web address] (P) [phone]  The patient was seen for a total of 20  minutes in face-to-face genetic counseling.   The patient was seen alone.  Drs. Magrinat, Lindi Adie and/or Burr Medico were available to discuss this case as needed.  _______________________________________________________________________ For Office Staff:  Number of people involved in session: 1 Was an Intern/ student involved with case: no

## 2021-05-17 ENCOUNTER — Other Ambulatory Visit: Payer: Self-pay | Admitting: General Surgery

## 2021-05-17 ENCOUNTER — Telehealth: Payer: Self-pay | Admitting: *Deleted

## 2021-05-17 ENCOUNTER — Encounter: Payer: Self-pay | Admitting: *Deleted

## 2021-05-17 DIAGNOSIS — C50412 Malignant neoplasm of upper-outer quadrant of left female breast: Secondary | ICD-10-CM

## 2021-05-17 DIAGNOSIS — Z170 Estrogen receptor positive status [ER+]: Secondary | ICD-10-CM

## 2021-05-17 NOTE — Telephone Encounter (Signed)
Spoke to pt concerning Loretta Griffin from 2.1.23. Denies questions or concerns regarding dx or treatment care plan. Encourage pt to call with needs.

## 2021-05-18 ENCOUNTER — Telehealth: Payer: Self-pay | Admitting: Hematology and Oncology

## 2021-05-18 NOTE — Telephone Encounter (Signed)
Sch per 2/9 inbasket, pt aware °

## 2021-05-21 ENCOUNTER — Telehealth: Payer: Self-pay | Admitting: Genetic Counselor

## 2021-05-21 NOTE — Telephone Encounter (Signed)
Revealed negative genetic testing for breast cancer STAT panel.  Discussed that we do not know why she has breast cancer or why there is cancer in the family. It could be sporadic/familial, due to a different gene that we are not testing, or maybe our current technology may not be able to pick something up.  It will be important for her to keep in contact with genetics to keep up with whether additional testing may be needed.  Results of pan-cancer panel.

## 2021-05-23 ENCOUNTER — Encounter: Payer: Self-pay | Admitting: *Deleted

## 2021-05-23 ENCOUNTER — Encounter: Payer: Self-pay | Admitting: General Practice

## 2021-05-23 NOTE — Progress Notes (Signed)
[facility] Spiritual Care Note  Followed up with Loretta Griffin by phone. 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 She is aware of ongoing availability of Electrical engineer and chaplain support as needed/desired.   [place], [place], [facility] Pager [phone] Voicemail [phone]

## 2021-05-28 ENCOUNTER — Encounter: Payer: Self-pay | Admitting: Genetic Counselor

## 2021-05-28 ENCOUNTER — Ambulatory Visit: Payer: Self-pay | Admitting: Genetic Counselor

## 2021-05-28 DIAGNOSIS — C50412 Malignant neoplasm of upper-outer quadrant of left female breast: Secondary | ICD-10-CM

## 2021-05-28 DIAGNOSIS — Z1379 Encounter for other screening for genetic and chromosomal anomalies: Secondary | ICD-10-CM

## 2021-05-28 DIAGNOSIS — Z803 Family history of malignant neoplasm of breast: Secondary | ICD-10-CM

## 2021-05-28 NOTE — Telephone Encounter (Signed)
Revealed negative pan-cancer panel.    

## 2021-05-28 NOTE — Progress Notes (Signed)
HPI:   Ms. Arman was previously seen in the [place] clinic due to a personal and family history of breast cancer and concerns regarding a hereditary predisposition to cancer. Please refer to our prior cancer genetics clinic note for more information regarding our discussion, assessment and recommendations, at the time. Ms. Luera recent genetic test results were disclosed to her, as were recommendations warranted by these results. These results and recommendations are discussed in more detail below.  CANCER HISTORY:  Oncology History  Malignant neoplasm of upper-outer quadrant of left breast in female, estrogen receptor positive ([place])  04/25/2021 Initial Diagnosis   Screening mammogram detected left breast mass UOQ 2:00: 0.7 cm: Ultrasound biopsy: Benign concordant; additional 0.3 cm distortion: Stereotactic biopsy: Grade 1 IDC ER 100%, PR 100%, HER2 negative, Ki-67 1%   05/09/2021 Cancer Staging   Staging form: Breast, AJCC 8th Edition - Clinical stage from 05/09/2021: Stage IA (cT1a, cN0, cM0, G1, ER+, PR+, HER2-) - Signed by Nicholas Lose, MD on 05/09/2021 Stage prefix: Initial diagnosis Histologic grading system: 3 grade system    05/21/2021 Genetic Testing   Negative hereditary cancer genetic testing: no pathogenic variants detected in [facility] CancerNext-Expanded +RNAinsight Panel.  Report date is 05/21/2021.   The CancerNext-Expanded gene panel offered by [facility] and includes sequencing, rearrangement, and RNA analysis for the following 77 genes: AIP, ALK, APC, ATM, AXIN2, BAP1, BARD1, BLM, BMPR1A, BRCA1, BRCA2, BRIP1, CDC73, CDH1, CDK4, CDKN1B, CDKN2A, CHEK2, CTNNA1, DICER1, FANCC, FH, FLCN, GALNT12, KIF1B, LZTR1, MAX, MEN1, MET, MLH1, MSH2, MSH3, MSH6, MUTYH, NBN, NF1, NF2, NTHL1, PALB2, PHOX2B, PMS2, POT1, PRKAR1A, PTCH1, PTEN, RAD51C, RAD51D, RB1, RECQL, RET, SDHA, SDHAF2, SDHB, SDHC, SDHD, SMAD4, SMARCA4, SMARCB1, SMARCE1, STK11, SUFU, TMEM127, TP53, TSC1, TSC2, VHL  and XRCC2 (sequencing and deletion/duplication); EGFR, EGLN1, HOXB13, KIT, MITF, PDGFRA, POLD1, and POLE (sequencing only); EPCAM and GREM1 (deletion/duplication only).      FAMILY HISTORY:  We obtained a detailed, 4-generation family history.  Significant diagnoses are listed below: Family History  Problem Relation Age of Onset   Breast cancer Mother        dx 50; dx 58; two primaries; mets   Lung cancer Maternal Uncle        dx after 49; smoking hx   Breast cancer Paternal Aunt        dx 58s    Ms. Krabbenhoft is unaware of previous family history of genetic testing for hereditary cancer risks. There is no reported Ashkenazi Jewish ancestry. There is no known consanguinity.    GENETIC TEST RESULTS:  The [facility] CancerNext-Expanded +RNAinsight Panel found no pathogenic mutations. The CancerNext-Expanded gene panel offered by [facility] and includes sequencing, rearrangement, and RNA analysis for the following 77 genes: AIP, ALK, APC, ATM, AXIN2, BAP1, BARD1, BLM, BMPR1A, BRCA1, BRCA2, BRIP1, CDC73, CDH1, CDK4, CDKN1B, CDKN2A, CHEK2, CTNNA1, DICER1, FANCC, FH, FLCN, GALNT12, KIF1B, LZTR1, MAX, MEN1, MET, MLH1, MSH2, MSH3, MSH6, MUTYH, NBN, NF1, NF2, NTHL1, PALB2, PHOX2B, PMS2, POT1, PRKAR1A, PTCH1, PTEN, RAD51C, RAD51D, RB1, RECQL, RET, SDHA, SDHAF2, SDHB, SDHC, SDHD, SMAD4, SMARCA4, SMARCB1, SMARCE1, STK11, SUFU, TMEM127, TP53, TSC1, TSC2, VHL and XRCC2 (sequencing and deletion/duplication); EGFR, EGLN1, HOXB13, KIT, MITF, PDGFRA, POLD1, and POLE (sequencing only); EPCAM and GREM1 (deletion/duplication only).   The test report has been scanned into EPIC and is located under the Molecular Pathology section of the Results Review tab.  A portion of the result report is included below for reference. Genetic testing reported out on May 21, 2021.  Even though a pathogenic variant was not identified, possible explanations for the cancer in the family may include: There may be no  hereditary risk for cancer in the family. The cancers in Ms. Tatem and/or her family may be sporadic/familial or due to other genetic and environmental factors. There may be a gene mutation in one of these genes that current testing methods cannot detect but that chance is small. There could be another gene that has not yet been discovered, or that we have not yet tested, that is responsible for the cancer diagnoses in the family.  It is also possible there is a hereditary cause for the cancer in the family that Ms. Crisp did not inherit.   Therefore, it is important to remain in touch with cancer genetics in the future so that we can continue to offer Ms. Porr the most up to date genetic testing.   ADDITIONAL GENETIC TESTING:  We discussed with Ms. Geddis that her genetic testing was fairly extensive.  If there are genes identified to increase cancer risk that can be analyzed in the future, we would be happy to discuss and coordinate this testing at that time.    CANCER SCREENING RECOMMENDATIONS:  Ms. Gilvin test result is considered negative (normal).  This means that we have not identified a hereditary cause for her personal history of breast cancer at this time.   An individual's cancer risk and medical management are not determined by genetic test results alone. Overall cancer risk assessment incorporates additional factors, including personal medical history, family history, and any available genetic information that may result in a personalized plan for cancer prevention and surveillance. Therefore, it is recommended she continue to follow the cancer management and screening guidelines provided by her oncology and primary healthcare provider.  RECOMMENDATIONS FOR FAMILY MEMBERS:   Since she did not inherit a identifiable mutation in a cancer predisposition gene included on this panel, her children could not have inherited a known mutation from her in one of these  genes. Individuals in this family might be at some increased risk of developing cancer, over the general population risk, due to the family history of cancer.  Individuals in the family should notify their providers of the family history of cancer. We recommend women in this family have a yearly mammogram beginning at age 57, or 56 years younger than the earliest onset of cancer, an annual clinical breast exam, and perform monthly breast self-exams.    FOLLOW-UP:  Lastly, we discussed with Ms. Scrima that cancer genetics is a rapidly advancing field and it is possible that new genetic tests will be appropriate for her and/or her family members in the future. We encouraged her to remain in contact with cancer genetics on an annual basis so we can update her personal and family histories and let her know of advances in cancer genetics that may benefit this family.   Our contact number was provided. Ms. Kees questions were answered to her satisfaction, and she knows she is welcome to call us at anytime with additional questions or concerns.   Keyden Pavlov M. Joette Catching, [place], [facility] Genetic Counselor [email or web address] (P) [phone]

## 2021-05-30 DIAGNOSIS — H401131 Primary open-angle glaucoma, bilateral, mild stage: Secondary | ICD-10-CM | POA: Diagnosis not present

## 2021-05-31 ENCOUNTER — Encounter (HOSPITAL_BASED_OUTPATIENT_CLINIC_OR_DEPARTMENT_OTHER): Payer: Self-pay | Admitting: General Surgery

## 2021-05-31 ENCOUNTER — Other Ambulatory Visit: Payer: Self-pay

## 2021-06-04 DIAGNOSIS — C50912 Malignant neoplasm of unspecified site of left female breast: Secondary | ICD-10-CM | POA: Diagnosis not present

## 2021-06-07 ENCOUNTER — Other Ambulatory Visit: Payer: Self-pay

## 2021-06-07 ENCOUNTER — Ambulatory Visit
Admission: RE | Admit: 2021-06-07 | Discharge: 2021-06-07 | Disposition: A | Discharge status: Home / Self Care | Payer: PPO | Source: Ambulatory Visit | Attending: General Surgery | Admitting: General Surgery

## 2021-06-07 DIAGNOSIS — R928 Other abnormal and inconclusive findings on diagnostic imaging of breast: Secondary | ICD-10-CM | POA: Diagnosis not present

## 2021-06-07 DIAGNOSIS — Z170 Estrogen receptor positive status [ER+]: Secondary | ICD-10-CM

## 2021-06-07 DIAGNOSIS — C50412 Malignant neoplasm of upper-outer quadrant of left female breast: Secondary | ICD-10-CM

## 2021-06-07 NOTE — Progress Notes (Signed)

## 2021-06-08 ENCOUNTER — Ambulatory Visit (HOSPITAL_BASED_OUTPATIENT_CLINIC_OR_DEPARTMENT_OTHER): Payer: PPO | Admitting: Anesthesiology

## 2021-06-08 ENCOUNTER — Encounter (HOSPITAL_BASED_OUTPATIENT_CLINIC_OR_DEPARTMENT_OTHER)
Admission: RE | Disposition: A | Discharge status: Home / Self Care | Payer: Self-pay | Source: Home / Self Care | Attending: General Surgery

## 2021-06-08 ENCOUNTER — Other Ambulatory Visit: Payer: Self-pay

## 2021-06-08 ENCOUNTER — Ambulatory Visit (HOSPITAL_BASED_OUTPATIENT_CLINIC_OR_DEPARTMENT_OTHER)
Admission: RE | Admit: 2021-06-08 | Discharge: 2021-06-08 | Disposition: A | Discharge status: Home / Self Care | Payer: PPO | Attending: General Surgery | Admitting: General Surgery

## 2021-06-08 ENCOUNTER — Ambulatory Visit
Admission: RE | Admit: 2021-06-08 | Discharge: 2021-06-08 | Disposition: A | Discharge status: Home / Self Care | Payer: PPO | Source: Ambulatory Visit | Attending: General Surgery | Admitting: General Surgery

## 2021-06-08 ENCOUNTER — Encounter (HOSPITAL_BASED_OUTPATIENT_CLINIC_OR_DEPARTMENT_OTHER): Payer: Self-pay | Admitting: General Surgery

## 2021-06-08 DIAGNOSIS — Z803 Family history of malignant neoplasm of breast: Secondary | ICD-10-CM | POA: Diagnosis not present

## 2021-06-08 DIAGNOSIS — Z170 Estrogen receptor positive status [ER+]: Secondary | ICD-10-CM

## 2021-06-08 DIAGNOSIS — C50912 Malignant neoplasm of unspecified site of left female breast: Secondary | ICD-10-CM

## 2021-06-08 DIAGNOSIS — D0512 Intraductal carcinoma in situ of left breast: Secondary | ICD-10-CM | POA: Diagnosis not present

## 2021-06-08 DIAGNOSIS — C50412 Malignant neoplasm of upper-outer quadrant of left female breast: Secondary | ICD-10-CM

## 2021-06-08 DIAGNOSIS — L989 Disorder of the skin and subcutaneous tissue, unspecified: Secondary | ICD-10-CM | POA: Diagnosis not present

## 2021-06-08 DIAGNOSIS — N6012 Diffuse cystic mastopathy of left breast: Secondary | ICD-10-CM | POA: Diagnosis not present

## 2021-06-08 DIAGNOSIS — R928 Other abnormal and inconclusive findings on diagnostic imaging of breast: Secondary | ICD-10-CM | POA: Diagnosis not present

## 2021-06-08 HISTORY — DX: Other complications of anesthesia, initial encounter: T88.59XA

## 2021-06-08 HISTORY — PX: BREAST LUMPECTOMY WITH RADIOACTIVE SEED LOCALIZATION: SHX6424

## 2021-06-08 SURGERY — BREAST LUMPECTOMY WITH RADIOACTIVE SEED LOCALIZATION
Anesthesia: General | Site: Breast | Laterality: Left

## 2021-06-08 MED ORDER — KETOROLAC TROMETHAMINE 30 MG/ML IJ SOLN: 30.0000 mg | Freq: Once | INTRAMUSCULAR | Status: DC | PRN

## 2021-06-08 MED ORDER — DROPERIDOL 2.5 MG/ML IJ SOLN
INTRAMUSCULAR | Status: DC | PRN
  Administered 2021-06-08: .625 mg via INTRAVENOUS

## 2021-06-08 MED ORDER — ONDANSETRON HCL 4 MG/2ML IJ SOLN: 4.0000 mg | Freq: Once | INTRAMUSCULAR | Status: DC | PRN

## 2021-06-08 MED ORDER — DEXAMETHASONE SODIUM PHOSPHATE 10 MG/ML IJ SOLN
INTRAMUSCULAR | Status: AC
  Filled 2021-06-08: qty 1

## 2021-06-08 MED ORDER — LIDOCAINE 2% (20 MG/ML) 5 ML SYRINGE
INTRAMUSCULAR | Status: AC
  Filled 2021-06-08: qty 5

## 2021-06-08 MED ORDER — 0.9 % SODIUM CHLORIDE (POUR BTL) OPTIME
TOPICAL | Status: DC | PRN
  Administered 2021-06-08: 60 mL

## 2021-06-08 MED ORDER — FENTANYL CITRATE (PF) 100 MCG/2ML IJ SOLN
INTRAMUSCULAR | Status: AC
  Filled 2021-06-08: qty 2

## 2021-06-08 MED ORDER — BUPIVACAINE-EPINEPHRINE (PF) 0.25% -1:200000 IJ SOLN
INTRAMUSCULAR | Status: DC | PRN
  Administered 2021-06-08: 7 mL

## 2021-06-08 MED ORDER — TRAMADOL HCL 50 MG PO TABS: 50.0000 mg | ORAL_TABLET | Freq: Four times a day (QID) | ORAL | 0 refills | Status: DC | PRN

## 2021-06-08 MED ORDER — CHLORHEXIDINE GLUCONATE CLOTH 2 % EX PADS: 6.0000 | MEDICATED_PAD | Freq: Once | CUTANEOUS | Status: DC

## 2021-06-08 MED ORDER — LIDOCAINE 2% (20 MG/ML) 5 ML SYRINGE
INTRAMUSCULAR | Status: DC | PRN
  Administered 2021-06-08: 100 mg via INTRAVENOUS

## 2021-06-08 MED ORDER — FENTANYL CITRATE (PF) 100 MCG/2ML IJ SOLN
INTRAMUSCULAR | Status: DC | PRN
  Administered 2021-06-08 (×2): 50 ug via INTRAVENOUS

## 2021-06-08 MED ORDER — GABAPENTIN 300 MG PO CAPS
ORAL_CAPSULE | ORAL | Status: AC
  Filled 2021-06-08: qty 1

## 2021-06-08 MED ORDER — CEFAZOLIN SODIUM-DEXTROSE 2-4 GM/100ML-% IV SOLN
2.0000 g | INTRAVENOUS | Status: AC
  Administered 2021-06-08: 2 g via INTRAVENOUS

## 2021-06-08 MED ORDER — ONDANSETRON HCL 4 MG/2ML IJ SOLN
INTRAMUSCULAR | Status: DC | PRN
  Administered 2021-06-08: 4 mg via INTRAVENOUS

## 2021-06-08 MED ORDER — EPHEDRINE SULFATE-NACL 50-0.9 MG/10ML-% IV SOSY
PREFILLED_SYRINGE | INTRAVENOUS | Status: DC | PRN
  Administered 2021-06-08: 10 mg via INTRAVENOUS

## 2021-06-08 MED ORDER — GABAPENTIN 300 MG PO CAPS
300.0000 mg | ORAL_CAPSULE | ORAL | Status: AC
  Administered 2021-06-08: 300 mg via ORAL

## 2021-06-08 MED ORDER — FENTANYL CITRATE (PF) 100 MCG/2ML IJ SOLN: 25.0000 ug | INTRAMUSCULAR | Status: DC | PRN

## 2021-06-08 MED ORDER — DEXAMETHASONE SODIUM PHOSPHATE 4 MG/ML IJ SOLN
INTRAMUSCULAR | Status: DC | PRN
  Administered 2021-06-08: 10 mg via INTRAVENOUS

## 2021-06-08 MED ORDER — ONDANSETRON HCL 4 MG/2ML IJ SOLN
INTRAMUSCULAR | Status: AC
Start: 2021-06-08 — End: ?
  Filled 2021-06-08: qty 2

## 2021-06-08 MED ORDER — CEFAZOLIN SODIUM-DEXTROSE 2-4 GM/100ML-% IV SOLN
INTRAVENOUS | Status: AC
  Filled 2021-06-08: qty 100

## 2021-06-08 MED ORDER — ACETAMINOPHEN 500 MG PO TABS
ORAL_TABLET | ORAL | Status: AC
  Filled 2021-06-08: qty 2

## 2021-06-08 MED ORDER — ACETAMINOPHEN 500 MG PO TABS
1000.0000 mg | ORAL_TABLET | ORAL | Status: AC
  Administered 2021-06-08: 1000 mg via ORAL

## 2021-06-08 MED ORDER — PROPOFOL 10 MG/ML IV BOLUS
INTRAVENOUS | Status: DC | PRN
  Administered 2021-06-08: 30 mg via INTRAVENOUS
  Administered 2021-06-08: 160 mg via INTRAVENOUS

## 2021-06-08 MED ORDER — LACTATED RINGERS IV SOLN: INTRAVENOUS | Status: DC

## 2021-06-08 MED ORDER — EPHEDRINE 5 MG/ML INJ
INTRAVENOUS | Status: AC
  Filled 2021-06-08: qty 5

## 2021-06-08 SURGICAL SUPPLY — 39 items
ADH SKN CLS APL DERMABOND .7 (GAUZE/BANDAGES/DRESSINGS) ×1
APL PRP STRL LF DISP 70% ISPRP (MISCELLANEOUS) ×1
APPLIER CLIP 9.375 MED OPEN (MISCELLANEOUS) ×2
APR CLP MED 9.3 20 MLT OPN (MISCELLANEOUS) ×1
BLADE SURG 15 STRL LF DISP TIS (BLADE) ×2 IMPLANT
BLADE SURG 15 STRL SS (BLADE) ×2
CANISTER SUCT 1200ML W/VALVE (MISCELLANEOUS) ×3 IMPLANT
CHLORAPREP W/TINT 26 (MISCELLANEOUS) ×3 IMPLANT
CLIP APPLIE 9.375 MED OPEN (MISCELLANEOUS) IMPLANT
COVER BACK TABLE 60X90IN (DRAPES) ×3 IMPLANT
COVER MAYO STAND STRL (DRAPES) ×3 IMPLANT
COVER PROBE W GEL 5X96 (DRAPES) ×3 IMPLANT
DERMABOND ADVANCED (GAUZE/BANDAGES/DRESSINGS) ×1
DERMABOND ADVANCED .7 DNX12 (GAUZE/BANDAGES/DRESSINGS) ×2 IMPLANT
DRAPE LAPAROSCOPIC ABDOMINAL (DRAPES) ×3 IMPLANT
DRAPE UTILITY XL STRL (DRAPES) ×3 IMPLANT
ELECT COATED BLADE 2.86 ST (ELECTRODE) ×3 IMPLANT
ELECT REM PT RETURN 9FT ADLT (ELECTROSURGICAL) ×2
ELECTRODE REM PT RTRN 9FT ADLT (ELECTROSURGICAL) ×2 IMPLANT
GLOVE SURG ENC MOIS LTX SZ7.5 (GLOVE) ×5 IMPLANT
GOWN STRL REUS W/ TWL LRG LVL3 (GOWN DISPOSABLE) ×4 IMPLANT
GOWN STRL REUS W/TWL LRG LVL3 (GOWN DISPOSABLE) ×4
KIT MARKER MARGIN INK (KITS) ×3 IMPLANT
NDL HYPO 25X1 1.5 SAFETY (NEEDLE) IMPLANT
NEEDLE HYPO 25X1 1.5 SAFETY (NEEDLE) ×2 IMPLANT
NS IRRIG 1000ML POUR BTL (IV SOLUTION) ×1 IMPLANT
PACK BASIN DAY SURGERY FS (CUSTOM PROCEDURE TRAY) ×3 IMPLANT
PENCIL SMOKE EVACUATOR (MISCELLANEOUS) ×3 IMPLANT
SLEEVE SCD COMPRESS KNEE MED (STOCKING) ×3 IMPLANT
SPIKE FLUID TRANSFER (MISCELLANEOUS) ×1 IMPLANT
SPONGE T-LAP 18X18 ~~LOC~~+RFID (SPONGE) ×4 IMPLANT
SUT MON AB 4-0 PC3 18 (SUTURE) ×3 IMPLANT
SUT SILK 2 0 SH (SUTURE) ×1 IMPLANT
SUT VICRYL 3-0 CR8 SH (SUTURE) ×3 IMPLANT
SYR CONTROL 10ML LL (SYRINGE) ×1 IMPLANT
TOWEL GREEN STERILE FF (TOWEL DISPOSABLE) ×3 IMPLANT
TRAY FAXITRON CT DISP (TRAY / TRAY PROCEDURE) ×3 IMPLANT
TUBE CONNECTING 20X1/4 (TUBING) ×3 IMPLANT
YANKAUER SUCT BULB TIP NO VENT (SUCTIONS) ×1 IMPLANT

## 2021-06-08 NOTE — Anesthesia Preprocedure Evaluation (Signed)
Anesthesia Evaluation  ?Patient identified by MRN, date of birth, ID band ?Patient awake ? ? ? ?Reviewed: ?Allergy & Precautions, NPO status , Patient's Chart, lab work & pertinent test results ? ?Airway ?Mallampati: II ? ?TM Distance: >3 FB ?Neck ROM: Full ? ? ? Dental ?no notable dental hx. ? ?  ?Pulmonary ?neg pulmonary ROS,  ?  ?Pulmonary exam normal ?breath sounds clear to auscultation ? ? ? ? ? ? Cardiovascular ?negative cardio ROS ?Normal cardiovascular exam ?Rhythm:Regular Rate:Normal ? ? ?  ?Neuro/Psych ?negative neurological ROS ? negative psych ROS  ? GI/Hepatic ?negative GI ROS, Neg liver ROS,   ?Endo/Other  ?negative endocrine ROS ? Renal/GU ?negative Renal ROS  ?negative genitourinary ?  ?Musculoskeletal ?negative musculoskeletal ROS ?(+)  ? Abdominal ?  ?Peds ?negative pediatric ROS ?(+)  Hematology ?negative hematology ROS ?(+)   ?Anesthesia Other Findings ? ? Reproductive/Obstetrics ?negative OB ROS ? ?  ? ? ? ? ? ? ? ? ? ? ? ? ? ?  ?  ? ? ? ? ? ? ? ? ?Anesthesia Physical ?Anesthesia Plan ? ?ASA: 2 ? ?Anesthesia Plan: General  ? ?Post-op Pain Management: Minimal or no pain anticipated  ? ?Induction: Intravenous ? ?PONV Risk Score and Plan: 3 and Ondansetron, Dexamethasone, Droperidol and Treatment may vary due to age or medical condition ? ?Airway Management Planned: LMA ? ?Additional Equipment:  ? ?Intra-op Plan:  ? ?Post-operative Plan: Extubation in OR ? ?Informed Consent: I have reviewed the patients History and Physical, chart, labs and discussed the procedure including the risks, benefits and alternatives for the proposed anesthesia with the patient or authorized representative who has indicated his/her understanding and acceptance.  ? ? ? ?Dental advisory given ? ?Plan Discussed with: CRNA and Surgeon ? ?Anesthesia Plan Comments:   ? ? ? ? ? ? ?Anesthesia Quick Evaluation ? ?

## 2021-06-08 NOTE — Op Note (Signed)
06/08/2021 ? ?9:32 AM ? ?PATIENT:  Loretta Griffin  71 y.o. female ? ?PRE-OPERATIVE DIAGNOSIS:  LEFT BREAST CANCER ? ?POST-OPERATIVE DIAGNOSIS:  LEFT BREAST CANCER ? ?PROCEDURE:  Procedure(s): ?LEFT BREAST LUMPECTOMY WITH RADIOACTIVE SEED LOCALIZATION (Left) ? ?SURGEON:  Surgeon(s) and Role: ?   Jovita Kussmaul, MD - Primary ? ?PHYSICIAN ASSISTANT:  ? ?ASSISTANTS: none  ? ?ANESTHESIA:   local and general ? ?EBL:  minimal  ? ?BLOOD ADMINISTERED:none ? ?DRAINS: none  ? ?LOCAL MEDICATIONS USED:  MARCAINE    ? ?SPECIMEN:  Source of Specimen:  left breast tissue with additional superior and inferior margins ? ?DISPOSITION OF SPECIMEN:  PATHOLOGY ? ?COUNTS:  YES ? ?TOURNIQUET:  * No tourniquets in log * ? ?DICTATION: .Dragon Dictation ? ?After informed consent was obtained the patient was brought to the operating room and placed in the supine position on the operating table.  After adequate induction of general anesthesia the patient's left breast was prepped with ChloraPrep, allowed to dry, and draped in usual sterile manner.  An appropriate timeout was performed.  Previously an I-125 seed was placed in the upper outer quadrant of the left breast to mark a small area of invasive breast cancer.  The neoprobe was set to I-125 in the area of radioactivity was readily identified.  Because of the superficial nature of the seed to the skin I elected to make an elliptical incision in the skin overlying the area of radioactivity with a 15 blade knife.  The incision was carried through the skin and subcutaneous tissue sharply with the electrocautery.  Dissection was then carried around the radioactive seed while checking the area of radioactivity frequently.  This dissection was carried all the way to the muscle of the chest wall.  Once the specimen was removed it was oriented with the appropriate paint colors.  A specimen radiograph was obtained that showed the clip and seed to be near the center of the specimen.  The specimen  was then sent to pathology for further evaluation.  I did elect to take additional superior and inferior margins and these were marked appropriately and also sent to pathology.  Hemostasis was achieved using the Bovie electrocautery.  The wound was irrigated with saline and infiltrated with quarter percent Marcaine.  The deep layer of the wound was closed with interrupted 3-0 Vicryl stitches.  The skin was closed with a running 4-0 Monocryl subcuticular stitch.  Dermabond dressings were applied.  The patient tolerated the procedure well.  At the end of the case all needle sponge and instrument counts were correct.  The patient was then awakened and taken to recovery in stable condition. ? ?PLAN OF CARE: Discharge to home after PACU ? ?PATIENT DISPOSITION:  PACU - hemodynamically stable. ?  ?Delay start of Pharmacological VTE agent (>24hrs) due to surgical blood loss or risk of bleeding: not applicable ? ?

## 2021-06-08 NOTE — Discharge Instructions (Signed)
No tylenol until 1:30pm ? ? ?Post Anesthesia Home Care Instructions ? ?Activity: ?Get plenty of rest for the remainder of the day. A responsible individual must stay with you for 24 hours following the procedure.  ?For the next 24 hours, DO NOT: ?-Drive a car ?-Paediatric nurse ?-Drink alcoholic beverages ?-Take any medication unless instructed by your physician ?-Make any legal decisions or sign important papers. ? ?Meals: ?Start with liquid foods such as gelatin or soup. Progress to regular foods as tolerated. Avoid greasy, spicy, heavy foods. If nausea and/or vomiting occur, drink only clear liquids until the nausea and/or vomiting subsides. Call your physician if vomiting continues. ? ?Special Instructions/Symptoms: ?Your throat may feel dry or sore from the anesthesia or the breathing tube placed in your throat during surgery. If this causes discomfort, gargle with warm salt water. The discomfort should disappear within 24 hours. ? ?If you had a scopolamine patch placed behind your ear for the management of post- operative nausea and/or vomiting: ? ?1. The medication in the patch is effective for 72 hours, after which it should be removed.  Wrap patch in a tissue and discard in the trash. Wash hands thoroughly with soap and water. ?2. You may remove the patch earlier than 72 hours if you experience unpleasant side effects which may include dry mouth, dizziness or visual disturbances. ?3. Avoid touching the patch. Wash your hands with soap and water after contact with the patch. ?    ?

## 2021-06-08 NOTE — H&P (Signed)
?PROVIDER: Landry Corporal, MD ? ?MRN: [id number] ?DOB: May 22, 1950 ?Subjective  ? ?Chief Complaint: Breast Cancer ? ? ?History of Present Illness: ?Loretta Griffin is a 71 y.o. female who is seen today as an office consultation at the request of Dr. Pasty Arch for evaluation of Breast Cancer ?.  ? ?We are asked to see the patient in consultation by Dr. Sela Hilding to evaluate her for a new left breast cancer. The patient is a 72 year old white female who recently went for a routine screening mammogram. At that time she was found to have a 3 mm mass in the upper outer quadrant of the left breast. The lymph nodes looked normal. The mass was biopsied and came back as a grade 1 invasive ductal cancer that was ER and PR positive and HER2 negative with a Ki-67 of 1%. She is otherwise in pretty good health. She does not smoke. She does have a history of subpectoral implants placed. She does have some family history of breast cancer as well. ? ?Review of Systems: ?A complete review of systems was obtained from the patient. I have reviewed this information and discussed as appropriate with the patient. See HPI as well for other ROS. ? ?ROS  ? ?Medical History: ?Past Medical History:  ?Diagnosis Date  ? Glaucoma (increased eye pressure)  ? ?Patient Active Problem List  ?Diagnosis  ? Malignant neoplasm of upper-outer quadrant of left breast in female, estrogen receptor positive (CMS-HCC)  ? Uterine leiomyoma  ? Glycosuria  ? Cervical high risk HPV (human papillomavirus) test positive  ? ?History reviewed. No pertinent surgical history.  ? ?Allergies  ?Allergen Reactions  ? Bimatoprost Other (See Comments)  ? Brimonidine-Timolol Unknown and Other (See Comments)  ? Brinzolamide-Brimonidine Other (See Comments)  ? Codeine Unknown and Vomiting  ? ?Current Outpatient Medications on File Prior to Visit  ?Medication Sig Dispense Refill  ? ALPHAGAN P 0.1 % ophthalmic solution Place 1 drop into both eyes 2 (two) times daily  ?  cycloSPORINE (RESTASIS) 0.05 % ophthalmic emulsion Place 1 drop into both eyes 2 (two) times daily  ? netarsudiL (RHOPRESSA) 0.02 % eye drops Place 1 drop into both eyes every evening  ? ?No current facility-administered medications on file prior to visit.  ? ?Family History  ?Problem Relation Age of Onset  ? Breast cancer Mother  ? Skin cancer Mother  ? Breast cancer Paternal Aunt  ? ? ?Social History  ? ?Tobacco Use  ?Smoking Status Never  ?Smokeless Tobacco Never  ? ? ?Social History  ? ?Socioeconomic History  ? Marital status: Married  ?Tobacco Use  ? Smoking status: Never  ? Smokeless tobacco: Never  ?Vaping Use  ? Vaping Use: Never used  ?Substance and Sexual Activity  ? Alcohol use: Never  ? Drug use: Never  ? ?Objective:  ?There were no vitals filed for this visit.  ?There is no height or weight on file to calculate BMI. ? ?Physical Exam ?Vitals reviewed.  ?Constitutional:  ?General: She is not in acute distress. ?Appearance: Normal appearance.  ?HENT:  ?Head: Normocephalic and atraumatic.  ?Right Ear: External ear normal.  ?Left Ear: External ear normal.  ?Nose: Nose normal.  ?Mouth/Throat:  ?Mouth: Mucous membranes are moist.  ?Pharynx: Oropharynx is clear.  ?Eyes:  ?General: No scleral icterus. ?Extraocular Movements: Extraocular movements intact.  ?Conjunctiva/sclera: Conjunctivae normal.  ?Pupils: Pupils are equal, round, and reactive to light.  ?Cardiovascular:  ?Rate and Rhythm: Normal rate and regular rhythm.  ?Pulses: Normal pulses.  ?Heart  sounds: Normal heart sounds.  ?Pulmonary:  ?Effort: Pulmonary effort is normal. No respiratory distress.  ?Breath sounds: Normal breath sounds.  ?Abdominal:  ?General: Bowel sounds are normal.  ?Palpations: Abdomen is soft.  ?Tenderness: There is no abdominal tenderness.  ?Musculoskeletal:  ?General: No swelling, tenderness or deformity. Normal range of motion.  ?Cervical back: Normal range of motion and neck supple.  ?Skin: ?General: Skin is warm and dry.   ?Coloration: Skin is not jaundiced.  ?Neurological:  ?General: No focal deficit present.  ?Mental Status: She is alert and oriented to person, place, and time.  ?Psychiatric:  ?Mood and Affect: Mood normal.  ?Behavior: Behavior normal.  ? ? ? ?Breast: There is no palpable mass in either augmented breast. There is no palpable axillary, supraclavicular, or cervical lymphadenopathy. ? ?Labs, Imaging and Diagnostic Testing: ? ?Assessment and Plan:  ? ?Diagnoses and all orders for this visit: ? ?Malignant neoplasm of upper-outer quadrant of left breast in female, estrogen receptor positive (CMS-HCC) ?- Ambulatory Referral to Oncology-Medical ?- Ambulatory Referral to Radiation Oncology ?- [facility] Case Posting Request; Future ? ? ? ?The patient appears to have a small 3 mm cancer in the upper outer quadrant of the left breast with clinically negative nodes and all favorable markers. I have discussed with her in detail the different options for treatment and at this point she is favoring breast conservation which I feel is very reasonable. I have discussed with her in detail the risks and benefits of the operation as well as some of the technical aspects including the use of a radioactive seed for localization and she understands and wishes to proceed. Given her age and small cancer with all favorable markers she will likely not need a node evaluation. She will meet with medical and radiation oncology to discuss adjuvant therapy. We will proceed with surgical planning. ?

## 2021-06-08 NOTE — Interval H&P Note (Signed)
History and Physical Interval Note: ? ?06/08/2021 ?8:06 AM ? ?Loretta Griffin  has presented today for surgery, with the diagnosis of LEFT BREAST CANCER.  The various methods of treatment have been discussed with the patient and family. After consideration of risks, benefits and other options for treatment, the patient has consented to  Procedure(s): ?LEFT BREAST LUMPECTOMY WITH RADIOACTIVE SEED LOCALIZATION (Left) as a surgical intervention.  The patient's history has been reviewed, patient examined, no change in status, stable for surgery.  I have reviewed the patient's chart and labs.  Questions were answered to the patient's satisfaction.   ? ? ?Autumn Messing III ? ? ?

## 2021-06-08 NOTE — Transfer of Care (Signed)
Immediate Anesthesia Transfer of Care Note ? ?Patient: Loretta Griffin ? ?Procedure(s) Performed: LEFT BREAST LUMPECTOMY WITH RADIOACTIVE SEED LOCALIZATION (Left: Breast) ? ?Patient Location: PACU ? ?Anesthesia Type:General ? ?Level of Consciousness: drowsy ? ?Airway & Oxygen Therapy: Patient Spontanous Breathing and Patient connected to face mask oxygen ? ?Post-op Assessment: Report given to RN and Post -op Vital signs reviewed and stable ? ?Post vital signs: Reviewed and stable ? ?Last Vitals:  ?Vitals Value Taken Time  ?BP    ?Temp    ?Pulse 83 06/08/21 0943  ?Resp 17 06/08/21 0943  ?SpO2 97 % 06/08/21 0943  ?Vitals shown include unvalidated device data. ? ?Last Pain:  ?Vitals:  ? 06/08/21 0717  ?TempSrc: Oral  ?PainSc: 0-No pain  ?   ? ?  ? ?Complications: No notable events documented. ?

## 2021-06-08 NOTE — Anesthesia Procedure Notes (Signed)
Procedure Name: LMA Insertion ?Date/Time: 06/08/2021 8:57 AM ?Performed by: Lieutenant Diego, CRNA ?Pre-anesthesia Checklist: Patient identified, Emergency Drugs available, Suction available and Patient being monitored ?Patient Re-evaluated:Patient Re-evaluated prior to induction ?Oxygen Delivery Method: Circle system utilized ?Preoxygenation: Pre-oxygenation with 100% oxygen ?Induction Type: IV induction ?Ventilation: Mask ventilation without difficulty ?LMA: LMA inserted ?LMA Size: 4.0 ?Number of attempts: 1 ?Placement Confirmation: positive ETCO2 and breath sounds checked- equal and bilateral ?Tube secured with: Tape ?Dental Injury: Teeth and Oropharynx as per pre-operative assessment  ? ? ? ? ?

## 2021-06-08 NOTE — Anesthesia Postprocedure Evaluation (Signed)
Anesthesia Post Note ? ?Patient: Loretta Griffin ? ?Procedure(s) Performed: LEFT BREAST LUMPECTOMY WITH RADIOACTIVE SEED LOCALIZATION (Left: Breast) ? ?  ? ?Patient location during evaluation: PACU ?Anesthesia Type: General ?Level of consciousness: awake and alert ?Pain management: pain level controlled ?Vital Signs Assessment: post-procedure vital signs reviewed and stable ?Respiratory status: spontaneous breathing, nonlabored ventilation, respiratory function stable and patient connected to nasal cannula oxygen ?Cardiovascular status: blood pressure returned to baseline and stable ?Postop Assessment: no apparent nausea or vomiting ?Anesthetic complications: no ? ? ?No notable events documented. ? ?Last Vitals:  ?Vitals:  ? 06/08/21 1015 06/08/21 1023  ?BP: 125/67 135/73  ?Pulse: 74 73  ?Resp: (!) 24 18  ?Temp:  36.4 ?C  ?SpO2: 92% 94%  ?  ?Last Pain:  ?Vitals:  ? 06/08/21 1023  ?TempSrc:   ?PainSc: 0-No pain  ? ? ?  ?  ?  ?  ?  ?  ? ?Staley Budzinski S ? ? ? ? ?

## 2021-06-11 ENCOUNTER — Encounter (HOSPITAL_BASED_OUTPATIENT_CLINIC_OR_DEPARTMENT_OTHER): Payer: Self-pay | Admitting: General Surgery

## 2021-06-11 LAB — SURGICAL PATHOLOGY

## 2021-06-13 ENCOUNTER — Encounter: Payer: Self-pay | Admitting: *Deleted

## 2021-06-14 NOTE — Progress Notes (Signed)
? ?Patient Care Team: ?Glenis Smoker, MD as PCP - General (Family Medicine) ?Jovita Kussmaul, MD as Consulting Physician (General Surgery) ?Nicholas Lose, MD as Consulting Physician (Hematology and Oncology) ?Kyung Rudd, MD as Consulting Physician (Radiation Oncology) ?Mauro Kaufmann, RN as Oncology Nurse Navigator ?Rockwell Germany, RN as Oncology Nurse Navigator ? ?DIAGNOSIS:  ?Encounter Diagnosis  ?Name Primary?  ? Malignant neoplasm of upper-outer quadrant of left breast in female, estrogen receptor positive ([place])   ? ? ?SUMMARY OF ONCOLOGIC HISTORY: ?Oncology History  ?Malignant neoplasm of upper-outer quadrant of left breast in female, estrogen receptor positive ([place])  ?04/25/2021 Initial Diagnosis  ? Screening mammogram detected left breast mass UOQ 2:00: 0.7 cm: Ultrasound biopsy: Benign concordant; additional 0.3 cm distortion: Stereotactic biopsy: Grade 1 IDC ER 100%, PR 100%, HER2 negative, Ki-67 1% ?  ?05/09/2021 Cancer Staging  ? Staging form: Breast, AJCC 8th Edition ?- Clinical stage from 05/09/2021: Stage IA (cT1a, cN0, cM0, G1, ER+, PR+, HER2-) - Signed by Nicholas Lose, MD on 05/09/2021 ?Stage prefix: Initial diagnosis ?Histologic grading system: 3 grade system ? ?  ?05/21/2021 Genetic Testing  ? Negative hereditary cancer genetic testing: no pathogenic variants detected in [facility] CancerNext-Expanded +RNAinsight Panel.  Report date is 05/21/2021.  ? ?The CancerNext-Expanded gene panel offered by [facility] and includes sequencing, rearrangement, and RNA analysis for the following 77 genes: AIP, ALK, APC, ATM, AXIN2, BAP1, BARD1, BLM, BMPR1A, BRCA1, BRCA2, BRIP1, CDC73, CDH1, CDK4, CDKN1B, CDKN2A, CHEK2, CTNNA1, DICER1, FANCC, FH, FLCN, GALNT12, KIF1B, LZTR1, MAX, MEN1, MET, MLH1, MSH2, MSH3, MSH6, MUTYH, NBN, NF1, NF2, NTHL1, PALB2, PHOX2B, PMS2, POT1, PRKAR1A, PTCH1, PTEN, RAD51C, RAD51D, RB1, RECQL, RET, SDHA, SDHAF2, SDHB, SDHC, SDHD, SMAD4, SMARCA4, SMARCB1, SMARCE1, STK11, SUFU, TMEM127,  TP53, TSC1, TSC2, VHL and XRCC2 (sequencing and deletion/duplication); EGFR, EGLN1, HOXB13, KIT, MITF, PDGFRA, POLD1, and POLE (sequencing only); EPCAM and GREM1 (deletion/duplication only).  ?  ?06/08/2021 Surgery  ? Left lumpectomy: Focal DCIS, no residual invasive cancer, margins negative, ER 100%, PR 100%, HER2 negative, Ki-67 1% (tumor size 2.5 mm based on the biopsy specimen) ?  ? ? ?CHIEF COMPLIANT: Follow-up after recent left lumpectomy ?  ? ?INTERVAL HISTORY: Loretta Griffin is 71 y.o. female is here because of recent surgery for invasive ductal carcinoma of the left breast.   She states that the surgery went very well.  Denies any pain or discomfort in the breast. ?   ? ? ?ALLERGIES:  is allergic to bimatoprost, brimonidine tartrate-timolol, brinzolamide-brimonidine, codeine, and rhopressa [netarsudil dimesylate]. ? ?MEDICATIONS:  ?Current Outpatient Medications  ?Medication Sig Dispense Refill  ? ALPHAGAN P 0.1 % SOLN   1  ? Ascorbic Acid (VITAMIN C) 1000 MG tablet 1 tablet    ? Boswellia Serrata Extract POWD 500 mg by Does not apply route daily.    ? Cholecalciferol (D3) 50 MCG (2000 UT) TABS Take by mouth.    ? diphenhydrAMINE (BENADRYL) 25 mg capsule Take 25 mg by mouth every 6 (six) hours as needed.    ? Glucosamine-Chondroit-Vit C-Mn (GLUCOSAMINE 1500 COMPLEX PO) Take by mouth.    ? Multiple Vitamins-Minerals (PRESERVISION AREDS 2+MULTI VIT PO) Take by mouth.    ? Omega-3 Fatty Acids (FISH OIL) 1000 MG CAPS Take 1 capsule by mouth daily.    ? RESTASIS MULTIDOSE 0.05 % ophthalmic emulsion INT 1 GTT INTO OU BID  3  ? traMADol (ULTRAM) 50 MG tablet Take 1 tablet (50 mg total) by mouth every 6 (six) hours as needed. 15  tablet 0  ? Turmeric 500 MG CAPS Take 1,500 mg by mouth daily.    ? vitamin B-12 (CYANOCOBALAMIN) 500 MCG tablet Take 5,000 mcg by mouth daily.    ? vitamin E 180 MG (400 UNITS) capsule Take 400 Units by mouth daily.    ? ?No current facility-administered medications for this visit.   ? ? ?PHYSICAL EXAMINATION: ?ECOG PERFORMANCE STATUS: 1 - Symptomatic but completely ambulatory ? ?Vitals:  ? 06/18/21 1145  ?BP: (!) 144/74  ?Pulse: 78  ?Resp: 18  ?Temp: (!) 97.5 ?F (36.4 ?C)  ?SpO2: 99%  ? ?Filed Weights  ? 06/18/21 1145  ?Weight: 197 lb 6.4 oz (89.5 kg)  ? ?  ? ?LABORATORY DATA:  ?I have reviewed the data as listed ?CMP Latest Ref Rng & Units 05/09/2021  ?Glucose 70 - 99 mg/dL 98  ?BUN 8 - 23 mg/dL 20  ?Creatinine 0.44 - 1.00 mg/dL 0.90  ?Sodium 135 - 145 mmol/L 140  ?Potassium 3.5 - 5.1 mmol/L 4.3  ?Chloride 98 - 111 mmol/L 108  ?CO2 22 - 32 mmol/L 26  ?Calcium 8.9 - 10.3 mg/dL 10.4(H)  ?Total Protein 6.5 - 8.1 g/dL 7.3  ?Total Bilirubin 0.3 - 1.2 mg/dL 0.3  ?Alkaline Phos 38 - 126 U/L 115  ?AST 15 - 41 U/L 12(L)  ?ALT 0 - 44 U/L 10  ? ? ?Lab Results  ?Component Value Date  ? WBC 5.9 05/09/2021  ? HGB 13.6 05/09/2021  ? HCT 41.8 05/09/2021  ? MCV 89.5 05/09/2021  ? PLT 253 05/09/2021  ? NEUTROABS 3.7 05/09/2021  ? ? ?ASSESSMENT & PLAN:  ?Malignant neoplasm of upper-outer quadrant of left breast in female, estrogen receptor positive ([place]) ?06/08/2021:Left lumpectomy: Focal DCIS, no residual invasive cancer, margins negative, ER 100%, PR 100%, HER2 negative, Ki-67 1% (tumor size 2.5 mm based on the biopsy specimen) ? ?Pathology counseling: I discussed the final pathology report of the patient provided  a copy of this report. I discussed the margins as well as lymph node surgeries. We also discussed the final staging along with previously performed ER/PR and HER-2/neu testing. ? ?Treatment plan: ?1.  Adjuvant radiation therapy ?2. adjuvant antiestrogen therapy with letrozole x5 years ? ?Return to clinic after radiation is complete ? ? ? ?No orders of the defined types were placed in this encounter. ? ?The patient has a good understanding of the overall plan. she agrees with it. she will call with any problems that may develop before the next visit here. ?Total time spent: 30 mins including face to  face time and time spent for planning, charting and co-ordination of care ? ? Harriette Ohara, MD ?06/18/21 ? ?I, Gardiner Coins, am acting as a scribe for Dr. Lindi Adie  ?  ?

## 2021-06-18 ENCOUNTER — Inpatient Hospital Stay: Payer: PPO | Attending: Hematology and Oncology | Admitting: Hematology and Oncology

## 2021-06-18 ENCOUNTER — Other Ambulatory Visit: Payer: Self-pay

## 2021-06-18 DIAGNOSIS — Z170 Estrogen receptor positive status [ER+]: Secondary | ICD-10-CM | POA: Diagnosis not present

## 2021-06-18 DIAGNOSIS — C50412 Malignant neoplasm of upper-outer quadrant of left female breast: Secondary | ICD-10-CM | POA: Diagnosis not present

## 2021-06-18 NOTE — Assessment & Plan Note (Signed)
06/08/2021:Left lumpectomy: Focal DCIS, no residual invasive cancer, margins negative, ER 100%, PR 100%, HER2 negative, Ki-67 1% (tumor size 2.5 mm based on the biopsy specimen) ? ?Pathology counseling: I discussed the final pathology report of the patient provided  a copy of this report. I discussed the margins as well as lymph node surgeries. We also discussed the final staging along with previously performed ER/PR and HER-2/neu testing. ? ?Treatment plan: ?1.  Adjuvant radiation therapy ?2. adjuvant antiestrogen therapy with letrozole x5 years ? ?Return to clinic after radiation is complete ? ?

## 2021-06-21 ENCOUNTER — Encounter (HOSPITAL_COMMUNITY): Payer: Self-pay

## 2021-06-27 DIAGNOSIS — H401131 Primary open-angle glaucoma, bilateral, mild stage: Secondary | ICD-10-CM | POA: Diagnosis not present

## 2021-07-02 ENCOUNTER — Telehealth: Payer: Self-pay

## 2021-07-02 NOTE — Telephone Encounter (Signed)
Verified patient identity and reminded patient of her 9:00am-07/03/21 in-person appointment w/ Shona Simpson PA-C. I advised patient to arrive 36mn early for check-in. I left my extension [phone] case patient needs anything. Patient verbalized understanding of information. ?

## 2021-07-02 NOTE — Progress Notes (Signed)
?Radiation Oncology         (336) [phone] ?________________________________ ? ?Name: Loretta Griffin        MRN: [id number]  ?Date of Service: 07/03/2021 DOB: Dec 27, 1950 ? ?TD:[id number], Anastasia Pall, MD  Nicholas Lose, MD    ? ?REFERRING PHYSICIAN: Nicholas Lose, MD ? ? ?DIAGNOSIS: The encounter diagnosis was Malignant neoplasm of upper-outer quadrant of left breast in female, estrogen receptor positive ([place]). ? ? ?HISTORY OF PRESENT ILLNESS: Loretta Griffin is a 71 y.o. female originally seen in the multidisciplinary breast clinic for a new diagnosis of left breast cancer. The patient was noted to have screening detected abnormality in the left breast.  By diagnostic ultrasound this was located in the upper outer quadrant in the 2 o'clock position.  It measured up to 7 mm in greatest dimension and her axilla on the left was negative for adenopathy.  Further imaging felt that this was actually smaller and possibly up to 3 mm in greatest dimension rather than 7 mm.  Initially her biopsy of the lesion was benign however it was not felt that this was an inadequate sample and she subsequently underwent stereotactic biopsy of the lesion which showed a grade 1 invasive ductal carcinoma that was ER/PR positive, HER2 negative with a Ki-67 of 1%.   ? ?Since her last visit, the patient underwent left lumpectomy on 06/08/2021 with Dr. Marlou Starks.  Final pathology showed focal DCIS that was felt to be low-grade without residual invasive disease.  The resection margin was closest at 1 mm to the lateral margin for DCIS, and additional left superior and left inferior margin excisions were negative.  She is seen to discuss adjuvant radiotherapy. She is scheduled to see Dr. Marlou Starks tomorrow but is not interested in further surgery.  ? ? ? ?PREVIOUS RADIATION THERAPY: No ? ? ?PAST MEDICAL HISTORY:  ?Past Medical History:  ?Diagnosis Date  ? Breast cancer ([place])   ? Complication of anesthesia   ? Family history of breast cancer 05/10/2021  ?    ? ? ?PAST SURGICAL HISTORY: ?Past Surgical History:  ?Procedure Laterality Date  ? BREAST LUMPECTOMY WITH RADIOACTIVE SEED LOCALIZATION Left 06/08/2021  ? Procedure: LEFT BREAST LUMPECTOMY WITH RADIOACTIVE SEED LOCALIZATION;  Surgeon: Jovita Kussmaul, MD;  Location: [facility];  Service: General;  Laterality: Left;  ? cataract surgery    ? eye lid lift    ? left knee arthroscopy    ? left lumpectomy    ? PLACEMENT OF BREAST IMPLANTS    ? ? ? ?FAMILY HISTORY:  ?Family History  ?Problem Relation Age of Onset  ? Breast cancer Mother   ?     dx 71; dx 85; two primaries; mets  ? Lung cancer Maternal Uncle   ?     dx after 50; smoking hx  ? Breast cancer Paternal Aunt   ?     dx 64s  ? ? ? ?SOCIAL HISTORY:  reports that she has never smoked. She has never used smokeless tobacco. She reports that she does not drink alcohol and does not use drugs.  The patient is single and lives in [place].  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 ? ?ALLERGIES: Bimatoprost, Brimonidine tartrate-timolol, Brinzolamide-brimonidine, Codeine, and Rhopressa [netarsudil dimesylate] ? ? ?MEDICATIONS:  ?Current Outpatient Medications  ?Medication Sig Dispense Refill  ? ALPHAGAN P 0.1 % SOLN   1  ? Ascorbic Acid (VITAMIN C) 1000 MG tablet  1 tablet    ? Boswellia Serrata Extract POWD 500 mg by Does not apply route daily.    ? Cholecalciferol (D3) 50 MCG (2000 UT) TABS Take by mouth.    ? diphenhydrAMINE (BENADRYL) 25 mg capsule Take 25 mg by mouth every 6 (six) hours as needed.    ? Glucosamine-Chondroit-Vit C-Mn (GLUCOSAMINE 1500 COMPLEX PO) Take by mouth.    ? Multiple Vitamins-Minerals (PRESERVISION AREDS 2+MULTI VIT PO) Take by mouth.    ? Omega-3 Fatty Acids (FISH OIL) 1000 MG CAPS Take 1 capsule by mouth daily.    ? RESTASIS MULTIDOSE 0.05 % ophthalmic emulsion INT 1 GTT INTO OU BID  3  ? traMADol (ULTRAM) 50 MG tablet Take 1 tablet (50 mg total) by mouth every 6 (six) hours as  needed. 15 tablet 0  ? Turmeric 500 MG CAPS Take 1,500 mg by mouth daily.    ? vitamin B-12 (CYANOCOBALAMIN) 500 MCG tablet Take 5,000 mcg by mouth daily.    ? vitamin E 180 MG (400 UNITS) capsule Take 400 Units by mouth daily.    ? ?No current facility-administered medications for this visit.  ? ? ? ?REVIEW OF SYSTEMS: On review of systems, the patient reports that she is doing well overall. She has not had any concerns regarding her healing, or difficulty with discomfort. No complaints are otherwise verbalized.  ? ?  ? ?PHYSICAL EXAM:  ?Wt Readings from Last 3 Encounters:  ?06/18/21 197 lb 6.4 oz (89.5 kg)  ?06/08/21 197 lb 5 oz (89.5 kg)  ?05/09/21 194 lb 8 oz (88.2 kg)  ? ?Temp Readings from Last 3 Encounters:  ?06/18/21 (!) 97.5 ?F (36.4 ?C) (Temporal)  ?06/08/21 97.6 ?F (36.4 ?C)  ?05/09/21 97.7 ?F (36.5 ?C) (Temporal)  ? ?BP Readings from Last 3 Encounters:  ?06/18/21 (!) 144/74  ?06/08/21 135/73  ?05/09/21 (!) 145/78  ? ?Pulse Readings from Last 3 Encounters:  ?06/18/21 78  ?06/08/21 73  ?05/09/21 70  ? ? ?In general this is a well appearing Caucasian female in no acute distress. She's alert and oriented x4 and appropriate throughout the examination. Cardiopulmonary assessment is negative for acute distress and she exhibits normal effort.  The patient's left breast reveals a well-healed surgical incision line without evidence of erythema separation or drainage. Retropectoral implant is noted. ? ? ? ?ECOG = 0 ? ?0 - Asymptomatic (Fully active, able to carry on all predisease activities without restriction) ? ?1 - Symptomatic but completely ambulatory (Restricted in physically strenuous activity but ambulatory and able to carry out work of a light or sedentary nature. For example, light housework, office work) ? ?2 - Symptomatic, <50% in bed during the day (Ambulatory and capable of all self care but unable to carry out any work activities. Up and about more than 50% of waking hours) ? ?3 - Symptomatic, >50%  in bed, but not bedbound (Capable of only limited self-care, confined to bed or chair 50% or more of waking hours) ? ?4 - Bedbound (Completely disabled. Cannot carry on any self-care. Totally confined to bed or chair) ? ?5 - Death ? ? Oken MM, Creech RH, Tormey DC, et al. [phone]). "Toxicity and response criteria of the [facility]". [place] Oncol. 5 (6): 649-55 ? ? ? ?LABORATORY DATA:  ?Lab Results  ?Component Value Date  ? WBC 5.9 05/09/2021  ? HGB 13.6 05/09/2021  ? HCT 41.8 05/09/2021  ? MCV 89.5 05/09/2021  ? PLT 253 05/09/2021  ? ?Lab  Results  ?Component Value Date  ? NA 140 05/09/2021  ? K 4.3 05/09/2021  ? CL 108 05/09/2021  ? CO2 26 05/09/2021  ? ?Lab Results  ?Component Value Date  ? ALT 10 05/09/2021  ? AST 12 (L) 05/09/2021  ? ALKPHOS 115 05/09/2021  ? BILITOT 0.3 05/09/2021  ? ?  ? ?RADIOGRAPHY: MM Breast Surgical Specimen ? ?Result Date: 06/08/2021 ?CLINICAL DATA:  Post lumpectomy specimen radiograph EXAM: SPECIMEN RADIOGRAPH OF THE LEFT BREAST COMPARISON:  Previous exam(s). FINDINGS: Status post excision of the left breast. The radioactive seed and biopsy marker clip are present, completely intact, and were marked for pathology. IMPRESSION: Specimen radiograph of the left breast. These results were called by telephone at the time of interpretation on 06/08/2021 at 9:24 am to provider Autumn Messing III , who verbally acknowledged these results. Electronically Signed   By: Audie Pinto M.D.   On: 06/08/2021 09:24 ? ?MM LT RADIOACTIVE SEED LOC MAMMO GUIDE ? ?Result Date: 06/07/2021 ?CLINICAL DATA:  Patient presents for radioactive seed localization of a small left breast carcinoma prior to surgical excision. EXAM: MAMMOGRAPHIC GUIDED RADIOACTIVE SEED LOCALIZATION OF THE LEFT BREAST COMPARISON:  Previous exam(s). FINDINGS: Patient presents for radioactive seed localization prior to surgical excision. I met with the patient and we discussed the procedure of seed localization including  benefits and alternatives. We discussed the high likelihood of a successful procedure. We discussed the risks of the procedure including infection, bleeding, tissue injury and further surgery. We discussed

## 2021-07-03 ENCOUNTER — Encounter: Payer: Self-pay | Admitting: Radiation Oncology

## 2021-07-03 ENCOUNTER — Ambulatory Visit: Payer: PPO | Admitting: Radiation Oncology

## 2021-07-03 ENCOUNTER — Ambulatory Visit
Admission: RE | Admit: 2021-07-03 | Discharge: 2021-07-03 | Disposition: A | Discharge status: Home / Self Care | Payer: PPO | Source: Ambulatory Visit | Attending: Radiation Oncology | Admitting: Radiation Oncology

## 2021-07-03 ENCOUNTER — Other Ambulatory Visit: Payer: Self-pay

## 2021-07-03 VITALS — BP 128/73 | HR 63 | Temp 97.7°F | Resp 20 | Ht 69.5 in | Wt 198.2 lb

## 2021-07-03 DIAGNOSIS — C50412 Malignant neoplasm of upper-outer quadrant of left female breast: Secondary | ICD-10-CM | POA: Diagnosis not present

## 2021-07-03 DIAGNOSIS — Z170 Estrogen receptor positive status [ER+]: Secondary | ICD-10-CM | POA: Diagnosis not present

## 2021-07-03 NOTE — Progress Notes (Signed)
Consult appointment. Verified patient identity and began nursing interview. Patient is doing well and reports no discomfort w/ LT breast at this time. ? ?Meaningful use complete. ?Postmenopausal- NO chances of pregnancy. ? ?BP 128/73 (BP Location: Left Arm, Patient Position: Sitting, Cuff Size: Normal)   Pulse 63   Temp 97.7 ?F (36.5 ?C) (Temporal)   Resp 20   Ht 5' 9.5" (1.765 m)   Wt 198 lb 3.2 oz (89.9 kg)   SpO2 98%   BMI 28.85 kg/m?  ? ?

## 2021-07-07 DIAGNOSIS — Z170 Estrogen receptor positive status [ER+]: Secondary | ICD-10-CM | POA: Insufficient documentation

## 2021-07-07 DIAGNOSIS — C50412 Malignant neoplasm of upper-outer quadrant of left female breast: Secondary | ICD-10-CM | POA: Insufficient documentation

## 2021-07-08 DIAGNOSIS — Z170 Estrogen receptor positive status [ER+]: Secondary | ICD-10-CM | POA: Diagnosis not present

## 2021-07-08 DIAGNOSIS — C50412 Malignant neoplasm of upper-outer quadrant of left female breast: Secondary | ICD-10-CM | POA: Diagnosis not present

## 2021-07-10 ENCOUNTER — Encounter: Payer: Self-pay | Admitting: *Deleted

## 2021-07-10 ENCOUNTER — Telehealth: Payer: Self-pay | Admitting: Hematology and Oncology

## 2021-07-10 ENCOUNTER — Other Ambulatory Visit: Payer: Self-pay | Admitting: *Deleted

## 2021-07-10 NOTE — Telephone Encounter (Signed)
.  Called patient to schedule appointment per 4/4 inbasket, patient is aware of date and time.   ?

## 2021-07-11 ENCOUNTER — Other Ambulatory Visit: Payer: Self-pay

## 2021-07-11 ENCOUNTER — Ambulatory Visit
Admission: RE | Admit: 2021-07-11 | Discharge: 2021-07-11 | Disposition: A | Discharge status: Home / Self Care | Payer: PPO | Source: Ambulatory Visit | Attending: Radiation Oncology | Admitting: Radiation Oncology

## 2021-07-11 DIAGNOSIS — C50412 Malignant neoplasm of upper-outer quadrant of left female breast: Secondary | ICD-10-CM | POA: Diagnosis not present

## 2021-07-11 DIAGNOSIS — Z170 Estrogen receptor positive status [ER+]: Secondary | ICD-10-CM | POA: Diagnosis not present

## 2021-07-11 DIAGNOSIS — Z510 Encounter for antineoplastic radiation therapy: Secondary | ICD-10-CM | POA: Diagnosis not present

## 2021-07-12 ENCOUNTER — Ambulatory Visit
Admission: RE | Admit: 2021-07-12 | Discharge: 2021-07-12 | Disposition: A | Discharge status: Home / Self Care | Payer: PPO | Source: Ambulatory Visit | Attending: Radiation Oncology | Admitting: Radiation Oncology

## 2021-07-12 ENCOUNTER — Ambulatory Visit: Payer: PPO

## 2021-07-12 DIAGNOSIS — C50412 Malignant neoplasm of upper-outer quadrant of left female breast: Secondary | ICD-10-CM | POA: Diagnosis not present

## 2021-07-12 DIAGNOSIS — Z170 Estrogen receptor positive status [ER+]: Secondary | ICD-10-CM | POA: Diagnosis not present

## 2021-07-12 DIAGNOSIS — Z510 Encounter for antineoplastic radiation therapy: Secondary | ICD-10-CM | POA: Diagnosis not present

## 2021-07-13 ENCOUNTER — Other Ambulatory Visit: Payer: Self-pay

## 2021-07-13 ENCOUNTER — Ambulatory Visit: Payer: PPO

## 2021-07-13 ENCOUNTER — Ambulatory Visit
Admission: RE | Admit: 2021-07-13 | Discharge: 2021-07-13 | Disposition: A | Discharge status: Home / Self Care | Payer: PPO | Source: Ambulatory Visit | Attending: Radiation Oncology | Admitting: Radiation Oncology

## 2021-07-13 DIAGNOSIS — Z170 Estrogen receptor positive status [ER+]: Secondary | ICD-10-CM

## 2021-07-13 DIAGNOSIS — Z510 Encounter for antineoplastic radiation therapy: Secondary | ICD-10-CM | POA: Diagnosis not present

## 2021-07-13 DIAGNOSIS — C50412 Malignant neoplasm of upper-outer quadrant of left female breast: Secondary | ICD-10-CM | POA: Diagnosis not present

## 2021-07-13 MED ORDER — RADIAPLEXRX EX GEL: Freq: Once | CUTANEOUS | Status: AC

## 2021-07-13 NOTE — Progress Notes (Signed)
Pt here for patient teaching.  Pt given Radiation and You booklet, skin care instructions, and Radiaplex.  Patient was advised to obtain over the counter aluminum free deodorant.  Reviewed areas of pertinence such as fatigue, hair loss, skin changes, breast tenderness, and breast swelling. Pt able to give teach back of to pat skin and use unscented/gentle soap,apply Radiaplex bid, avoid applying anything to skin within 4 hours of treatment, avoid wearing an under wire bra, and to use an electric razor if they must shave. Pt verbalizes understanding of information given and will contact nursing with any questions or concerns.   ? ?Gloriajean Dell. Leonie Green, BSN  ?

## 2021-07-16 ENCOUNTER — Ambulatory Visit
Admission: RE | Admit: 2021-07-16 | Discharge: 2021-07-16 | Disposition: A | Discharge status: Home / Self Care | Payer: PPO | Source: Ambulatory Visit | Attending: Radiation Oncology | Admitting: Radiation Oncology

## 2021-07-16 ENCOUNTER — Ambulatory Visit: Payer: PPO

## 2021-07-16 ENCOUNTER — Other Ambulatory Visit: Payer: Self-pay

## 2021-07-16 DIAGNOSIS — Z510 Encounter for antineoplastic radiation therapy: Secondary | ICD-10-CM | POA: Diagnosis not present

## 2021-07-16 DIAGNOSIS — Z170 Estrogen receptor positive status [ER+]: Secondary | ICD-10-CM | POA: Diagnosis not present

## 2021-07-16 DIAGNOSIS — C50412 Malignant neoplasm of upper-outer quadrant of left female breast: Secondary | ICD-10-CM | POA: Diagnosis not present

## 2021-07-17 ENCOUNTER — Ambulatory Visit: Payer: PPO

## 2021-07-17 ENCOUNTER — Ambulatory Visit
Admission: RE | Admit: 2021-07-17 | Discharge: 2021-07-17 | Disposition: A | Discharge status: Home / Self Care | Payer: PPO | Source: Ambulatory Visit | Attending: Radiation Oncology | Admitting: Radiation Oncology

## 2021-07-17 DIAGNOSIS — Z170 Estrogen receptor positive status [ER+]: Secondary | ICD-10-CM | POA: Diagnosis not present

## 2021-07-17 DIAGNOSIS — Z510 Encounter for antineoplastic radiation therapy: Secondary | ICD-10-CM | POA: Diagnosis not present

## 2021-07-17 DIAGNOSIS — C50412 Malignant neoplasm of upper-outer quadrant of left female breast: Secondary | ICD-10-CM | POA: Diagnosis not present

## 2021-07-18 ENCOUNTER — Other Ambulatory Visit: Payer: Self-pay

## 2021-07-18 ENCOUNTER — Ambulatory Visit: Payer: PPO

## 2021-07-18 ENCOUNTER — Ambulatory Visit
Admission: RE | Admit: 2021-07-18 | Discharge: 2021-07-18 | Disposition: A | Discharge status: Home / Self Care | Payer: PPO | Source: Ambulatory Visit | Attending: Radiation Oncology | Admitting: Radiation Oncology

## 2021-07-18 DIAGNOSIS — C50412 Malignant neoplasm of upper-outer quadrant of left female breast: Secondary | ICD-10-CM | POA: Diagnosis not present

## 2021-07-18 DIAGNOSIS — Z170 Estrogen receptor positive status [ER+]: Secondary | ICD-10-CM | POA: Diagnosis not present

## 2021-07-18 DIAGNOSIS — Z510 Encounter for antineoplastic radiation therapy: Secondary | ICD-10-CM | POA: Diagnosis not present

## 2021-07-19 ENCOUNTER — Ambulatory Visit: Payer: PPO

## 2021-07-19 ENCOUNTER — Ambulatory Visit
Admission: RE | Admit: 2021-07-19 | Discharge: 2021-07-19 | Disposition: A | Discharge status: Home / Self Care | Payer: PPO | Source: Ambulatory Visit | Attending: Radiation Oncology | Admitting: Radiation Oncology

## 2021-07-19 DIAGNOSIS — C50412 Malignant neoplasm of upper-outer quadrant of left female breast: Secondary | ICD-10-CM | POA: Diagnosis not present

## 2021-07-19 DIAGNOSIS — Z510 Encounter for antineoplastic radiation therapy: Secondary | ICD-10-CM | POA: Diagnosis not present

## 2021-07-19 DIAGNOSIS — Z170 Estrogen receptor positive status [ER+]: Secondary | ICD-10-CM | POA: Diagnosis not present

## 2021-07-20 ENCOUNTER — Other Ambulatory Visit: Payer: Self-pay

## 2021-07-20 ENCOUNTER — Ambulatory Visit
Admission: RE | Admit: 2021-07-20 | Discharge: 2021-07-20 | Disposition: A | Discharge status: Home / Self Care | Payer: PPO | Source: Ambulatory Visit | Attending: Radiation Oncology | Admitting: Radiation Oncology

## 2021-07-20 ENCOUNTER — Ambulatory Visit: Payer: PPO

## 2021-07-20 DIAGNOSIS — Z170 Estrogen receptor positive status [ER+]: Secondary | ICD-10-CM | POA: Diagnosis not present

## 2021-07-20 DIAGNOSIS — Z510 Encounter for antineoplastic radiation therapy: Secondary | ICD-10-CM | POA: Diagnosis not present

## 2021-07-20 DIAGNOSIS — C50412 Malignant neoplasm of upper-outer quadrant of left female breast: Secondary | ICD-10-CM | POA: Diagnosis not present

## 2021-07-23 ENCOUNTER — Other Ambulatory Visit: Payer: Self-pay

## 2021-07-23 ENCOUNTER — Ambulatory Visit
Admission: RE | Admit: 2021-07-23 | Discharge: 2021-07-23 | Disposition: A | Discharge status: Home / Self Care | Payer: PPO | Source: Ambulatory Visit | Attending: Radiation Oncology | Admitting: Radiation Oncology

## 2021-07-23 ENCOUNTER — Ambulatory Visit: Payer: PPO

## 2021-07-23 DIAGNOSIS — C50412 Malignant neoplasm of upper-outer quadrant of left female breast: Secondary | ICD-10-CM | POA: Diagnosis not present

## 2021-07-23 DIAGNOSIS — Z510 Encounter for antineoplastic radiation therapy: Secondary | ICD-10-CM | POA: Diagnosis not present

## 2021-07-23 DIAGNOSIS — Z170 Estrogen receptor positive status [ER+]: Secondary | ICD-10-CM | POA: Diagnosis not present

## 2021-07-24 ENCOUNTER — Ambulatory Visit: Payer: PPO

## 2021-07-24 ENCOUNTER — Ambulatory Visit
Admission: RE | Admit: 2021-07-24 | Discharge: 2021-07-24 | Disposition: A | Discharge status: Home / Self Care | Payer: PPO | Source: Ambulatory Visit | Attending: Radiation Oncology | Admitting: Radiation Oncology

## 2021-07-24 ENCOUNTER — Other Ambulatory Visit: Payer: Self-pay

## 2021-07-24 DIAGNOSIS — Z510 Encounter for antineoplastic radiation therapy: Secondary | ICD-10-CM | POA: Diagnosis not present

## 2021-07-24 DIAGNOSIS — Z170 Estrogen receptor positive status [ER+]: Secondary | ICD-10-CM | POA: Diagnosis not present

## 2021-07-24 DIAGNOSIS — C50412 Malignant neoplasm of upper-outer quadrant of left female breast: Secondary | ICD-10-CM | POA: Diagnosis not present

## 2021-07-24 LAB — RAD ONC ARIA SESSION SUMMARY
Course Elapsed Days: 13
Plan Fractions Treated to Date: 10
Plan Prescribed Dose Per Fraction: 1.8 Gy
Plan Total Fractions Prescribed: 28
Plan Total Prescribed Dose: 50.4 Gy
Reference Point Dosage Given to Date: 18 Gy
Reference Point Session Dosage Given: 1.8 Gy
Session Number: 10

## 2021-07-25 ENCOUNTER — Other Ambulatory Visit: Payer: Self-pay

## 2021-07-25 ENCOUNTER — Ambulatory Visit
Admission: RE | Admit: 2021-07-25 | Discharge: 2021-07-25 | Disposition: A | Discharge status: Home / Self Care | Payer: PPO | Source: Ambulatory Visit | Attending: Radiation Oncology | Admitting: Radiation Oncology

## 2021-07-25 ENCOUNTER — Ambulatory Visit: Payer: PPO

## 2021-07-25 DIAGNOSIS — C50412 Malignant neoplasm of upper-outer quadrant of left female breast: Secondary | ICD-10-CM | POA: Diagnosis not present

## 2021-07-25 DIAGNOSIS — Z170 Estrogen receptor positive status [ER+]: Secondary | ICD-10-CM | POA: Diagnosis not present

## 2021-07-25 DIAGNOSIS — Z510 Encounter for antineoplastic radiation therapy: Secondary | ICD-10-CM | POA: Diagnosis not present

## 2021-07-25 LAB — RAD ONC ARIA SESSION SUMMARY
Course Elapsed Days: 14
Plan Fractions Treated to Date: 11
Plan Prescribed Dose Per Fraction: 1.8 Gy
Plan Total Fractions Prescribed: 28
Plan Total Prescribed Dose: 50.4 Gy
Reference Point Dosage Given to Date: 19.8 Gy
Reference Point Session Dosage Given: 1.8 Gy
Session Number: 11

## 2021-07-26 ENCOUNTER — Other Ambulatory Visit: Payer: Self-pay

## 2021-07-26 ENCOUNTER — Ambulatory Visit
Admission: RE | Admit: 2021-07-26 | Discharge: 2021-07-26 | Disposition: A | Discharge status: Home / Self Care | Payer: PPO | Source: Ambulatory Visit | Attending: Radiation Oncology | Admitting: Radiation Oncology

## 2021-07-26 ENCOUNTER — Ambulatory Visit: Payer: PPO

## 2021-07-26 DIAGNOSIS — Z170 Estrogen receptor positive status [ER+]: Secondary | ICD-10-CM | POA: Diagnosis not present

## 2021-07-26 DIAGNOSIS — C50412 Malignant neoplasm of upper-outer quadrant of left female breast: Secondary | ICD-10-CM | POA: Diagnosis not present

## 2021-07-26 DIAGNOSIS — Z510 Encounter for antineoplastic radiation therapy: Secondary | ICD-10-CM | POA: Diagnosis not present

## 2021-07-26 LAB — RAD ONC ARIA SESSION SUMMARY
Course Elapsed Days: 15
Plan Fractions Treated to Date: 12
Plan Prescribed Dose Per Fraction: 1.8 Gy
Plan Total Fractions Prescribed: 28
Plan Total Prescribed Dose: 50.4 Gy
Reference Point Dosage Given to Date: 21.6 Gy
Reference Point Session Dosage Given: 1.8 Gy
Session Number: 12

## 2021-07-27 ENCOUNTER — Ambulatory Visit
Admission: RE | Admit: 2021-07-27 | Discharge: 2021-07-27 | Disposition: A | Discharge status: Home / Self Care | Payer: PPO | Source: Ambulatory Visit | Attending: Radiation Oncology | Admitting: Radiation Oncology

## 2021-07-27 ENCOUNTER — Other Ambulatory Visit: Payer: Self-pay

## 2021-07-27 ENCOUNTER — Ambulatory Visit: Payer: PPO | Admitting: Radiation Oncology

## 2021-07-27 ENCOUNTER — Ambulatory Visit: Payer: PPO

## 2021-07-27 DIAGNOSIS — C50412 Malignant neoplasm of upper-outer quadrant of left female breast: Secondary | ICD-10-CM | POA: Diagnosis not present

## 2021-07-27 DIAGNOSIS — Z510 Encounter for antineoplastic radiation therapy: Secondary | ICD-10-CM | POA: Diagnosis not present

## 2021-07-27 DIAGNOSIS — Z170 Estrogen receptor positive status [ER+]: Secondary | ICD-10-CM | POA: Diagnosis not present

## 2021-07-27 LAB — RAD ONC ARIA SESSION SUMMARY
Course Elapsed Days: 16
Plan Fractions Treated to Date: 13
Plan Prescribed Dose Per Fraction: 1.8 Gy
Plan Total Fractions Prescribed: 28
Plan Total Prescribed Dose: 50.4 Gy
Reference Point Dosage Given to Date: 23.4 Gy
Reference Point Session Dosage Given: 1.8 Gy
Session Number: 13

## 2021-07-30 ENCOUNTER — Other Ambulatory Visit: Payer: Self-pay

## 2021-07-30 ENCOUNTER — Ambulatory Visit
Admission: RE | Admit: 2021-07-30 | Discharge: 2021-07-30 | Disposition: A | Discharge status: Home / Self Care | Payer: PPO | Source: Ambulatory Visit | Attending: Radiation Oncology | Admitting: Radiation Oncology

## 2021-07-30 ENCOUNTER — Ambulatory Visit: Payer: PPO

## 2021-07-30 DIAGNOSIS — C50412 Malignant neoplasm of upper-outer quadrant of left female breast: Secondary | ICD-10-CM | POA: Diagnosis not present

## 2021-07-30 DIAGNOSIS — Z510 Encounter for antineoplastic radiation therapy: Secondary | ICD-10-CM | POA: Diagnosis not present

## 2021-07-30 DIAGNOSIS — Z170 Estrogen receptor positive status [ER+]: Secondary | ICD-10-CM | POA: Diagnosis not present

## 2021-07-30 LAB — RAD ONC ARIA SESSION SUMMARY
Course Elapsed Days: 19
Plan Fractions Treated to Date: 14
Plan Prescribed Dose Per Fraction: 1.8 Gy
Plan Total Fractions Prescribed: 28
Plan Total Prescribed Dose: 50.4 Gy
Reference Point Dosage Given to Date: 25.2 Gy
Reference Point Session Dosage Given: 1.8 Gy
Session Number: 14

## 2021-07-31 ENCOUNTER — Ambulatory Visit: Payer: PPO

## 2021-07-31 ENCOUNTER — Ambulatory Visit
Admission: RE | Admit: 2021-07-31 | Discharge: 2021-07-31 | Disposition: A | Discharge status: Home / Self Care | Payer: PPO | Source: Ambulatory Visit | Attending: Radiation Oncology | Admitting: Radiation Oncology

## 2021-07-31 ENCOUNTER — Other Ambulatory Visit: Payer: Self-pay

## 2021-07-31 DIAGNOSIS — C50412 Malignant neoplasm of upper-outer quadrant of left female breast: Secondary | ICD-10-CM | POA: Diagnosis not present

## 2021-07-31 DIAGNOSIS — Z510 Encounter for antineoplastic radiation therapy: Secondary | ICD-10-CM | POA: Diagnosis not present

## 2021-07-31 DIAGNOSIS — Z170 Estrogen receptor positive status [ER+]: Secondary | ICD-10-CM | POA: Diagnosis not present

## 2021-07-31 LAB — RAD ONC ARIA SESSION SUMMARY
Course Elapsed Days: 20
Plan Fractions Treated to Date: 15
Plan Prescribed Dose Per Fraction: 1.8 Gy
Plan Total Fractions Prescribed: 28
Plan Total Prescribed Dose: 50.4 Gy
Reference Point Dosage Given to Date: 27 Gy
Reference Point Session Dosage Given: 1.8 Gy
Session Number: 15

## 2021-08-01 ENCOUNTER — Ambulatory Visit: Payer: PPO

## 2021-08-01 ENCOUNTER — Other Ambulatory Visit: Payer: Self-pay

## 2021-08-01 ENCOUNTER — Ambulatory Visit
Admission: RE | Admit: 2021-08-01 | Discharge: 2021-08-01 | Disposition: A | Discharge status: Home / Self Care | Payer: PPO | Source: Ambulatory Visit | Attending: Radiation Oncology | Admitting: Radiation Oncology

## 2021-08-01 DIAGNOSIS — C50412 Malignant neoplasm of upper-outer quadrant of left female breast: Secondary | ICD-10-CM | POA: Diagnosis not present

## 2021-08-01 DIAGNOSIS — Z510 Encounter for antineoplastic radiation therapy: Secondary | ICD-10-CM | POA: Diagnosis not present

## 2021-08-01 DIAGNOSIS — Z170 Estrogen receptor positive status [ER+]: Secondary | ICD-10-CM | POA: Diagnosis not present

## 2021-08-01 LAB — RAD ONC ARIA SESSION SUMMARY
Course Elapsed Days: 21
Plan Fractions Treated to Date: 16
Plan Prescribed Dose Per Fraction: 1.8 Gy
Plan Total Fractions Prescribed: 28
Plan Total Prescribed Dose: 50.4 Gy
Reference Point Dosage Given to Date: 28.8 Gy
Reference Point Session Dosage Given: 1.8 Gy
Session Number: 16

## 2021-08-02 ENCOUNTER — Ambulatory Visit: Payer: PPO

## 2021-08-02 ENCOUNTER — Other Ambulatory Visit: Payer: Self-pay

## 2021-08-02 ENCOUNTER — Ambulatory Visit
Admission: RE | Admit: 2021-08-02 | Discharge: 2021-08-02 | Disposition: A | Discharge status: Home / Self Care | Payer: PPO | Source: Ambulatory Visit | Attending: Radiation Oncology | Admitting: Radiation Oncology

## 2021-08-02 DIAGNOSIS — Z510 Encounter for antineoplastic radiation therapy: Secondary | ICD-10-CM | POA: Diagnosis not present

## 2021-08-02 DIAGNOSIS — Z170 Estrogen receptor positive status [ER+]: Secondary | ICD-10-CM | POA: Diagnosis not present

## 2021-08-02 DIAGNOSIS — C50412 Malignant neoplasm of upper-outer quadrant of left female breast: Secondary | ICD-10-CM | POA: Diagnosis not present

## 2021-08-02 LAB — RAD ONC ARIA SESSION SUMMARY
Course Elapsed Days: 22
Plan Fractions Treated to Date: 17
Plan Prescribed Dose Per Fraction: 1.8 Gy
Plan Total Fractions Prescribed: 28
Plan Total Prescribed Dose: 50.4 Gy
Reference Point Dosage Given to Date: 30.6 Gy
Reference Point Session Dosage Given: 1.8 Gy
Session Number: 17

## 2021-08-03 ENCOUNTER — Ambulatory Visit
Admission: RE | Admit: 2021-08-03 | Discharge: 2021-08-03 | Disposition: A | Discharge status: Home / Self Care | Payer: PPO | Source: Ambulatory Visit | Attending: Radiation Oncology | Admitting: Radiation Oncology

## 2021-08-03 ENCOUNTER — Other Ambulatory Visit: Payer: Self-pay

## 2021-08-03 ENCOUNTER — Ambulatory Visit: Payer: PPO

## 2021-08-03 DIAGNOSIS — Z170 Estrogen receptor positive status [ER+]: Secondary | ICD-10-CM | POA: Diagnosis not present

## 2021-08-03 DIAGNOSIS — Z510 Encounter for antineoplastic radiation therapy: Secondary | ICD-10-CM | POA: Diagnosis not present

## 2021-08-03 DIAGNOSIS — C50412 Malignant neoplasm of upper-outer quadrant of left female breast: Secondary | ICD-10-CM | POA: Diagnosis not present

## 2021-08-03 LAB — RAD ONC ARIA SESSION SUMMARY
Course Elapsed Days: 23
Plan Fractions Treated to Date: 18
Plan Prescribed Dose Per Fraction: 1.8 Gy
Plan Total Fractions Prescribed: 28
Plan Total Prescribed Dose: 50.4 Gy
Reference Point Dosage Given to Date: 32.4 Gy
Reference Point Session Dosage Given: 1.8 Gy
Session Number: 18

## 2021-08-06 ENCOUNTER — Other Ambulatory Visit: Payer: Self-pay

## 2021-08-06 ENCOUNTER — Ambulatory Visit
Admission: RE | Admit: 2021-08-06 | Discharge: 2021-08-06 | Disposition: A | Discharge status: Home / Self Care | Payer: PPO | Source: Ambulatory Visit | Attending: Radiation Oncology | Admitting: Radiation Oncology

## 2021-08-06 ENCOUNTER — Ambulatory Visit: Payer: PPO

## 2021-08-06 DIAGNOSIS — C50412 Malignant neoplasm of upper-outer quadrant of left female breast: Secondary | ICD-10-CM | POA: Insufficient documentation

## 2021-08-06 DIAGNOSIS — Z170 Estrogen receptor positive status [ER+]: Secondary | ICD-10-CM | POA: Insufficient documentation

## 2021-08-06 DIAGNOSIS — Z510 Encounter for antineoplastic radiation therapy: Secondary | ICD-10-CM | POA: Diagnosis not present

## 2021-08-06 LAB — RAD ONC ARIA SESSION SUMMARY
Course Elapsed Days: 26
Plan Fractions Treated to Date: 19
Plan Prescribed Dose Per Fraction: 1.8 Gy
Plan Total Fractions Prescribed: 28
Plan Total Prescribed Dose: 50.4 Gy
Reference Point Dosage Given to Date: 34.2 Gy
Reference Point Session Dosage Given: 1.8 Gy
Session Number: 19

## 2021-08-06 NOTE — Progress Notes (Signed)
? ?Patient Care Team: ?Glenis Smoker, MD as PCP - General (Family Medicine) ?Jovita Kussmaul, MD as Consulting Physician (General Surgery) ?Nicholas Lose, MD as Consulting Physician (Hematology and Oncology) ?Kyung Rudd, MD as Consulting Physician (Radiation Oncology) ?Mauro Kaufmann, RN as Oncology Nurse Navigator ?Rockwell Germany, RN as Oncology Nurse Navigator ? ?DIAGNOSIS:  ?Encounter Diagnosis  ?Name Primary?  ? Malignant neoplasm of upper-outer quadrant of left breast in female, estrogen receptor positive ([place])   ? ? ?SUMMARY OF ONCOLOGIC HISTORY: ?Oncology History  ?Malignant neoplasm of upper-outer quadrant of left breast in female, estrogen receptor positive ([place])  ?04/25/2021 Initial Diagnosis  ? Screening mammogram detected left breast mass UOQ 2:00: 0.7 cm: Ultrasound biopsy: Benign concordant; additional 0.3 cm distortion: Stereotactic biopsy: Grade 1 IDC ER 100%, PR 100%, HER2 negative, Ki-67 1% ?  ?05/09/2021 Cancer Staging  ? Staging form: Breast, AJCC 8th Edition ?- Clinical stage from 05/09/2021: Stage IA (cT1a, cN0, cM0, G1, ER+, PR+, HER2-) - Signed by Nicholas Lose, MD on 05/09/2021 ?Stage prefix: Initial diagnosis ?Histologic grading system: 3 grade system ? ?  ?05/21/2021 Genetic Testing  ? Negative hereditary cancer genetic testing: no pathogenic variants detected in [facility] CancerNext-Expanded +RNAinsight Panel.  Report date is 05/21/2021.  ? ?The CancerNext-Expanded gene panel offered by [facility] and includes sequencing, rearrangement, and RNA analysis for the following 77 genes: AIP, ALK, APC, ATM, AXIN2, BAP1, BARD1, BLM, BMPR1A, BRCA1, BRCA2, BRIP1, CDC73, CDH1, CDK4, CDKN1B, CDKN2A, CHEK2, CTNNA1, DICER1, FANCC, FH, FLCN, GALNT12, KIF1B, LZTR1, MAX, MEN1, MET, MLH1, MSH2, MSH3, MSH6, MUTYH, NBN, NF1, NF2, NTHL1, PALB2, PHOX2B, PMS2, POT1, PRKAR1A, PTCH1, PTEN, RAD51C, RAD51D, RB1, RECQL, RET, SDHA, SDHAF2, SDHB, SDHC, SDHD, SMAD4, SMARCA4, SMARCB1, SMARCE1, STK11, SUFU, TMEM127,  TP53, TSC1, TSC2, VHL and XRCC2 (sequencing and deletion/duplication); EGFR, EGLN1, HOXB13, KIT, MITF, PDGFRA, POLD1, and POLE (sequencing only); EPCAM and GREM1 (deletion/duplication only).  ?  ?06/08/2021 Surgery  ? Left lumpectomy: Focal DCIS, no residual invasive cancer, margins negative, ER 100%, PR 100%, HER2 negative, Ki-67 1% (tumor size 2.5 mm based on the biopsy specimen) ?  ?07/12/2021 - 08/24/2021 Radiation Therapy  ? Adjuvant radiation ?  ? ? ?CHIEF COMPLIANT: Follow-up to discuss antiestrogen therapy ?  ? ?INTERVAL HISTORY: AMILIA VANDENBRINK is a 71 y.o. female is here because of recent surgery for invasive ductal carcinoma of the left breast. She presents to the clinic today for a follow-up to discuss antiestrogen therapy.  She is tolerating radiation fairly well without any major problems or concerns but she does have mild radiation dermatitis. ?  ? ?ALLERGIES:  is allergic to bimatoprost, brimonidine tartrate-timolol, brinzolamide-brimonidine, codeine, and rhopressa [netarsudil dimesylate]. ? ?MEDICATIONS:  ?Current Outpatient Medications  ?Medication Sig Dispense Refill  ? [START ON 09/06/2021] letrozole (FEMARA) 2.5 MG tablet Take 1 tablet (2.5 mg total) by mouth daily. 90 tablet 3  ? ALPHAGAN P 0.1 % SOLN   1  ? Ascorbic Acid (VITAMIN C) 1000 MG tablet 1 tablet    ? Boswellia Serrata Extract POWD 500 mg by Does not apply route daily.    ? Cholecalciferol (D3) 50 MCG (2000 UT) TABS Take by mouth.    ? diphenhydrAMINE (BENADRYL) 25 mg capsule Take 25 mg by mouth every 6 (six) hours as needed.    ? Glucosamine-Chondroit-Vit C-Mn (GLUCOSAMINE 1500 COMPLEX PO) Take by mouth.    ? Multiple Vitamins-Minerals (PRESERVISION AREDS 2+MULTI VIT PO) Take by mouth.    ? Omega-3 Fatty Acids (FISH OIL) 1000 MG CAPS  Take 1 capsule by mouth daily.    ? RESTASIS MULTIDOSE 0.05 % ophthalmic emulsion INT 1 GTT INTO OU BID  3  ? traMADol (ULTRAM) 50 MG tablet Take 1 tablet (50 mg total) by mouth every 6 (six) hours as  needed. (Patient not taking: Reported on 07/03/2021) 15 tablet 0  ? Turmeric 500 MG CAPS Take 1,500 mg by mouth daily.    ? vitamin B-12 (CYANOCOBALAMIN) 500 MCG tablet Take 5,000 mcg by mouth daily.    ? vitamin E 180 MG (400 UNITS) capsule Take 400 Units by mouth daily.    ? ?No current facility-administered medications for this visit.  ? ? ?PHYSICAL EXAMINATION: ?ECOG PERFORMANCE STATUS: 1 - Symptomatic but completely ambulatory ? ?Vitals:  ? 08/20/21 1412  ?BP: (!) 148/80  ?Pulse: 63  ?Resp: 18  ?Temp: 97.7 ?F (36.5 ?C)  ?SpO2: 98%  ? ?Filed Weights  ? 08/20/21 1412  ?Weight: 195 lb 3.2 oz (88.5 kg)  ?  ? ?LABORATORY DATA:  ?I have reviewed the data as listed ? ?  Latest Ref Rng & Units 05/09/2021  ? 12:36 PM  ?CMP  ?Glucose 70 - 99 mg/dL 98    ?BUN 8 - 23 mg/dL 20    ?Creatinine 0.44 - 1.00 mg/dL 0.90    ?Sodium 135 - 145 mmol/L 140    ?Potassium 3.5 - 5.1 mmol/L 4.3    ?Chloride 98 - 111 mmol/L 108    ?CO2 22 - 32 mmol/L 26    ?Calcium 8.9 - 10.3 mg/dL 10.4    ?Total Protein 6.5 - 8.1 g/dL 7.3    ?Total Bilirubin 0.3 - 1.2 mg/dL 0.3    ?Alkaline Phos 38 - 126 U/L 115    ?AST 15 - 41 U/L 12    ?ALT 0 - 44 U/L 10    ? ? ?Lab Results  ?Component Value Date  ? WBC 5.9 05/09/2021  ? HGB 13.6 05/09/2021  ? HCT 41.8 05/09/2021  ? MCV 89.5 05/09/2021  ? PLT 253 05/09/2021  ? NEUTROABS 3.7 05/09/2021  ? ? ?ASSESSMENT & PLAN:  ?Malignant neoplasm of upper-outer quadrant of left breast in female, estrogen receptor positive ([place]) ?06/08/2021:Left lumpectomy: Focal DCIS, no residual invasive cancer, margins negative, ER 100%, PR 100%, HER2 negative, Ki-67 1% (tumor size 2.5 mm based on the biopsy specimen) ?  ?Treatment plan: ?1.  Adjuvant radiation therapy 07/12/2021-08/24/2021 ?2. adjuvant antiestrogen therapy with letrozole x5 years to start 09/06/2021 ?  ?Letrozole counseling: We discussed the risks and benefits of anti-estrogen therapy with aromatase inhibitors. These include but not limited to insomnia, hot flashes, mood  changes, vaginal dryness, bone density loss, and weight gain. We strongly believe that the benefits far outweigh the risks. Patient understands these risks and consented to starting treatment. Planned treatment duration is 5-7  years. ? ?Return to clinic in 3 months for survivorship care plan visit ? ? ? ?No orders of the defined types were placed in this encounter. ? ?The patient has a good understanding of the overall plan. she agrees with it. she will call with any problems that may develop before the next visit here. ?Total time spent: 30 mins including face to face time and time spent for planning, charting and co-ordination of care ? ? Harriette Ohara, MD ?08/20/21 ? ? ? I Gardiner Coins am scribing for Dr. Lindi Adie ? ?I have reviewed the above documentation for accuracy and completeness, and I agree with the above. ?  ?

## 2021-08-07 ENCOUNTER — Ambulatory Visit
Admission: RE | Admit: 2021-08-07 | Discharge: 2021-08-07 | Disposition: A | Discharge status: Home / Self Care | Payer: PPO | Source: Ambulatory Visit | Attending: Radiation Oncology | Admitting: Radiation Oncology

## 2021-08-07 ENCOUNTER — Other Ambulatory Visit: Payer: Self-pay

## 2021-08-07 ENCOUNTER — Ambulatory Visit: Payer: PPO

## 2021-08-07 DIAGNOSIS — C50412 Malignant neoplasm of upper-outer quadrant of left female breast: Secondary | ICD-10-CM | POA: Diagnosis not present

## 2021-08-07 DIAGNOSIS — Z510 Encounter for antineoplastic radiation therapy: Secondary | ICD-10-CM | POA: Diagnosis not present

## 2021-08-07 DIAGNOSIS — Z170 Estrogen receptor positive status [ER+]: Secondary | ICD-10-CM | POA: Diagnosis not present

## 2021-08-07 LAB — RAD ONC ARIA SESSION SUMMARY
Course Elapsed Days: 27
Plan Fractions Treated to Date: 20
Plan Prescribed Dose Per Fraction: 1.8 Gy
Plan Total Fractions Prescribed: 28
Plan Total Prescribed Dose: 50.4 Gy
Reference Point Dosage Given to Date: 36 Gy
Reference Point Session Dosage Given: 1.8 Gy
Session Number: 20

## 2021-08-08 ENCOUNTER — Other Ambulatory Visit: Payer: Self-pay

## 2021-08-08 ENCOUNTER — Ambulatory Visit
Admission: RE | Admit: 2021-08-08 | Discharge: 2021-08-08 | Disposition: A | Discharge status: Home / Self Care | Payer: PPO | Source: Ambulatory Visit | Attending: Radiation Oncology | Admitting: Radiation Oncology

## 2021-08-08 DIAGNOSIS — C50412 Malignant neoplasm of upper-outer quadrant of left female breast: Secondary | ICD-10-CM | POA: Diagnosis not present

## 2021-08-08 DIAGNOSIS — Z170 Estrogen receptor positive status [ER+]: Secondary | ICD-10-CM | POA: Diagnosis not present

## 2021-08-08 DIAGNOSIS — Z510 Encounter for antineoplastic radiation therapy: Secondary | ICD-10-CM | POA: Diagnosis not present

## 2021-08-08 LAB — RAD ONC ARIA SESSION SUMMARY
Course Elapsed Days: 28
Plan Fractions Treated to Date: 21
Plan Prescribed Dose Per Fraction: 1.8 Gy
Plan Total Fractions Prescribed: 28
Plan Total Prescribed Dose: 50.4 Gy
Reference Point Dosage Given to Date: 37.8 Gy
Reference Point Session Dosage Given: 1.8 Gy
Session Number: 21

## 2021-08-09 ENCOUNTER — Ambulatory Visit
Admission: RE | Admit: 2021-08-09 | Discharge: 2021-08-09 | Disposition: A | Discharge status: Home / Self Care | Payer: PPO | Source: Ambulatory Visit | Attending: Radiation Oncology | Admitting: Radiation Oncology

## 2021-08-09 ENCOUNTER — Other Ambulatory Visit: Payer: Self-pay

## 2021-08-09 DIAGNOSIS — Z170 Estrogen receptor positive status [ER+]: Secondary | ICD-10-CM | POA: Diagnosis not present

## 2021-08-09 DIAGNOSIS — C50412 Malignant neoplasm of upper-outer quadrant of left female breast: Secondary | ICD-10-CM | POA: Diagnosis not present

## 2021-08-09 DIAGNOSIS — Z510 Encounter for antineoplastic radiation therapy: Secondary | ICD-10-CM | POA: Diagnosis not present

## 2021-08-09 LAB — RAD ONC ARIA SESSION SUMMARY
Course Elapsed Days: 29
Plan Fractions Treated to Date: 22
Plan Prescribed Dose Per Fraction: 1.8 Gy
Plan Total Fractions Prescribed: 28
Plan Total Prescribed Dose: 50.4 Gy
Reference Point Dosage Given to Date: 39.6 Gy
Reference Point Session Dosage Given: 1.8 Gy
Session Number: 22

## 2021-08-10 ENCOUNTER — Ambulatory Visit
Admission: RE | Admit: 2021-08-10 | Discharge: 2021-08-10 | Disposition: A | Discharge status: Home / Self Care | Payer: PPO | Source: Ambulatory Visit | Attending: Radiation Oncology | Admitting: Radiation Oncology

## 2021-08-10 ENCOUNTER — Other Ambulatory Visit: Payer: Self-pay

## 2021-08-10 DIAGNOSIS — C50412 Malignant neoplasm of upper-outer quadrant of left female breast: Secondary | ICD-10-CM | POA: Diagnosis not present

## 2021-08-10 DIAGNOSIS — Z170 Estrogen receptor positive status [ER+]: Secondary | ICD-10-CM | POA: Diagnosis not present

## 2021-08-10 DIAGNOSIS — Z510 Encounter for antineoplastic radiation therapy: Secondary | ICD-10-CM | POA: Diagnosis not present

## 2021-08-10 LAB — RAD ONC ARIA SESSION SUMMARY
Course Elapsed Days: 30
Plan Fractions Treated to Date: 23
Plan Prescribed Dose Per Fraction: 1.8 Gy
Plan Total Fractions Prescribed: 28
Plan Total Prescribed Dose: 50.4 Gy
Reference Point Dosage Given to Date: 41.4 Gy
Reference Point Session Dosage Given: 1.8 Gy
Session Number: 23

## 2021-08-13 ENCOUNTER — Other Ambulatory Visit: Payer: Self-pay

## 2021-08-13 ENCOUNTER — Ambulatory Visit
Admission: RE | Admit: 2021-08-13 | Discharge: 2021-08-13 | Disposition: A | Discharge status: Home / Self Care | Payer: PPO | Source: Ambulatory Visit | Attending: Radiation Oncology | Admitting: Radiation Oncology

## 2021-08-13 DIAGNOSIS — Z510 Encounter for antineoplastic radiation therapy: Secondary | ICD-10-CM | POA: Diagnosis not present

## 2021-08-13 DIAGNOSIS — Z170 Estrogen receptor positive status [ER+]: Secondary | ICD-10-CM | POA: Diagnosis not present

## 2021-08-13 DIAGNOSIS — C50412 Malignant neoplasm of upper-outer quadrant of left female breast: Secondary | ICD-10-CM | POA: Diagnosis not present

## 2021-08-13 LAB — RAD ONC ARIA SESSION SUMMARY
Course Elapsed Days: 33
Plan Fractions Treated to Date: 24
Plan Prescribed Dose Per Fraction: 1.8 Gy
Plan Total Fractions Prescribed: 28
Plan Total Prescribed Dose: 50.4 Gy
Reference Point Dosage Given to Date: 43.2 Gy
Reference Point Session Dosage Given: 1.8 Gy
Session Number: 24

## 2021-08-14 ENCOUNTER — Other Ambulatory Visit: Payer: Self-pay

## 2021-08-14 ENCOUNTER — Ambulatory Visit
Admission: RE | Admit: 2021-08-14 | Discharge: 2021-08-14 | Disposition: A | Discharge status: Home / Self Care | Payer: PPO | Source: Ambulatory Visit | Attending: Radiation Oncology | Admitting: Radiation Oncology

## 2021-08-14 DIAGNOSIS — C50412 Malignant neoplasm of upper-outer quadrant of left female breast: Secondary | ICD-10-CM | POA: Diagnosis not present

## 2021-08-14 DIAGNOSIS — Z510 Encounter for antineoplastic radiation therapy: Secondary | ICD-10-CM | POA: Diagnosis not present

## 2021-08-14 DIAGNOSIS — Z170 Estrogen receptor positive status [ER+]: Secondary | ICD-10-CM | POA: Diagnosis not present

## 2021-08-14 LAB — RAD ONC ARIA SESSION SUMMARY
Course Elapsed Days: 34
Plan Fractions Treated to Date: 25
Plan Prescribed Dose Per Fraction: 1.8 Gy
Plan Total Fractions Prescribed: 28
Plan Total Prescribed Dose: 50.4 Gy
Reference Point Dosage Given to Date: 45 Gy
Reference Point Session Dosage Given: 1.8 Gy
Session Number: 25

## 2021-08-15 ENCOUNTER — Other Ambulatory Visit: Payer: Self-pay

## 2021-08-15 ENCOUNTER — Ambulatory Visit
Admission: RE | Admit: 2021-08-15 | Discharge: 2021-08-15 | Disposition: A | Discharge status: Home / Self Care | Payer: PPO | Source: Ambulatory Visit | Attending: Radiation Oncology | Admitting: Radiation Oncology

## 2021-08-15 DIAGNOSIS — C50412 Malignant neoplasm of upper-outer quadrant of left female breast: Secondary | ICD-10-CM | POA: Diagnosis not present

## 2021-08-15 DIAGNOSIS — Z170 Estrogen receptor positive status [ER+]: Secondary | ICD-10-CM | POA: Diagnosis not present

## 2021-08-15 DIAGNOSIS — Z510 Encounter for antineoplastic radiation therapy: Secondary | ICD-10-CM | POA: Diagnosis not present

## 2021-08-15 LAB — RAD ONC ARIA SESSION SUMMARY
Course Elapsed Days: 35
Plan Fractions Treated to Date: 26
Plan Prescribed Dose Per Fraction: 1.8 Gy
Plan Total Fractions Prescribed: 28
Plan Total Prescribed Dose: 50.4 Gy
Reference Point Dosage Given to Date: 46.8 Gy
Reference Point Session Dosage Given: 1.8 Gy
Session Number: 26

## 2021-08-16 ENCOUNTER — Other Ambulatory Visit: Payer: Self-pay

## 2021-08-16 ENCOUNTER — Ambulatory Visit
Admission: RE | Admit: 2021-08-16 | Discharge: 2021-08-16 | Disposition: A | Discharge status: Home / Self Care | Payer: PPO | Source: Ambulatory Visit | Attending: Radiation Oncology | Admitting: Radiation Oncology

## 2021-08-16 DIAGNOSIS — Z510 Encounter for antineoplastic radiation therapy: Secondary | ICD-10-CM | POA: Diagnosis not present

## 2021-08-16 DIAGNOSIS — Z170 Estrogen receptor positive status [ER+]: Secondary | ICD-10-CM | POA: Diagnosis not present

## 2021-08-16 DIAGNOSIS — C50412 Malignant neoplasm of upper-outer quadrant of left female breast: Secondary | ICD-10-CM | POA: Diagnosis not present

## 2021-08-16 LAB — RAD ONC ARIA SESSION SUMMARY
Course Elapsed Days: 36
Plan Fractions Treated to Date: 27
Plan Prescribed Dose Per Fraction: 1.8 Gy
Plan Total Fractions Prescribed: 28
Plan Total Prescribed Dose: 50.4 Gy
Reference Point Dosage Given to Date: 48.6 Gy
Reference Point Session Dosage Given: 1.8 Gy
Session Number: 27

## 2021-08-17 ENCOUNTER — Other Ambulatory Visit: Payer: Self-pay

## 2021-08-17 ENCOUNTER — Ambulatory Visit
Admission: RE | Admit: 2021-08-17 | Discharge: 2021-08-17 | Disposition: A | Discharge status: Home / Self Care | Payer: PPO | Source: Ambulatory Visit | Attending: Radiation Oncology | Admitting: Radiation Oncology

## 2021-08-17 DIAGNOSIS — C50412 Malignant neoplasm of upper-outer quadrant of left female breast: Secondary | ICD-10-CM | POA: Diagnosis not present

## 2021-08-17 DIAGNOSIS — Z510 Encounter for antineoplastic radiation therapy: Secondary | ICD-10-CM | POA: Diagnosis not present

## 2021-08-17 DIAGNOSIS — Z170 Estrogen receptor positive status [ER+]: Secondary | ICD-10-CM | POA: Diagnosis not present

## 2021-08-17 LAB — RAD ONC ARIA SESSION SUMMARY
Course Elapsed Days: 37
Plan Fractions Treated to Date: 28
Plan Prescribed Dose Per Fraction: 1.8 Gy
Plan Total Fractions Prescribed: 28
Plan Total Prescribed Dose: 50.4 Gy
Reference Point Dosage Given to Date: 50.4 Gy
Reference Point Session Dosage Given: 1.8 Gy
Session Number: 28

## 2021-08-20 ENCOUNTER — Inpatient Hospital Stay: Payer: PPO | Admitting: Hematology and Oncology

## 2021-08-20 ENCOUNTER — Ambulatory Visit
Admission: RE | Admit: 2021-08-20 | Discharge: 2021-08-20 | Disposition: A | Discharge status: Home / Self Care | Payer: PPO | Source: Ambulatory Visit | Attending: Radiation Oncology | Admitting: Radiation Oncology

## 2021-08-20 ENCOUNTER — Other Ambulatory Visit: Payer: Self-pay

## 2021-08-20 DIAGNOSIS — C50412 Malignant neoplasm of upper-outer quadrant of left female breast: Secondary | ICD-10-CM | POA: Insufficient documentation

## 2021-08-20 DIAGNOSIS — Z170 Estrogen receptor positive status [ER+]: Secondary | ICD-10-CM | POA: Insufficient documentation

## 2021-08-20 DIAGNOSIS — Z510 Encounter for antineoplastic radiation therapy: Secondary | ICD-10-CM | POA: Diagnosis not present

## 2021-08-20 LAB — RAD ONC ARIA SESSION SUMMARY
Course Elapsed Days: 40
Plan Fractions Treated to Date: 1
Plan Prescribed Dose Per Fraction: 2 Gy
Plan Total Fractions Prescribed: 5
Plan Total Prescribed Dose: 10 Gy
Reference Point Dosage Given to Date: 52.4 Gy
Reference Point Session Dosage Given: 2 Gy
Session Number: 29

## 2021-08-20 MED ORDER — LETROZOLE 2.5 MG PO TABS: 2.5000 mg | ORAL_TABLET | Freq: Every day | ORAL | 3 refills | Status: DC

## 2021-08-20 NOTE — Assessment & Plan Note (Signed)
06/08/2021:Left lumpectomy: Focal DCIS, no residual invasive cancer, margins negative, ER 100%, PR 100%, HER2 negative, Ki-67 1% (tumor size 2.5 mm based on the biopsy specimen) ?? ?Treatment plan: ?1.  Adjuvant radiation therapy 07/12/2021-08/24/2021 ?2. adjuvant antiestrogen therapy with letrozole x5 years to start 09/06/2021 ?? ?Letrozole counseling: We discussed the risks and benefits of anti-estrogen therapy with aromatase inhibitors. These include but not limited to insomnia, hot flashes, mood changes, vaginal dryness, bone density loss, and weight gain. We strongly believe that the benefits far outweigh the risks. Patient understands these risks and consented to starting treatment. Planned treatment duration is 5-7  years. ? ?Return to clinic in 3 months for survivorship care plan visit ?

## 2021-08-21 ENCOUNTER — Ambulatory Visit
Admission: RE | Admit: 2021-08-21 | Discharge: 2021-08-21 | Disposition: A | Discharge status: Home / Self Care | Payer: PPO | Source: Ambulatory Visit | Attending: Radiation Oncology | Admitting: Radiation Oncology

## 2021-08-21 ENCOUNTER — Telehealth: Payer: Self-pay | Admitting: Hematology and Oncology

## 2021-08-21 ENCOUNTER — Other Ambulatory Visit: Payer: Self-pay

## 2021-08-21 DIAGNOSIS — Z510 Encounter for antineoplastic radiation therapy: Secondary | ICD-10-CM | POA: Diagnosis not present

## 2021-08-21 DIAGNOSIS — C50412 Malignant neoplasm of upper-outer quadrant of left female breast: Secondary | ICD-10-CM | POA: Diagnosis not present

## 2021-08-21 DIAGNOSIS — Z170 Estrogen receptor positive status [ER+]: Secondary | ICD-10-CM | POA: Diagnosis not present

## 2021-08-21 LAB — RAD ONC ARIA SESSION SUMMARY
Course Elapsed Days: 41
Plan Fractions Treated to Date: 2
Plan Prescribed Dose Per Fraction: 2 Gy
Plan Total Fractions Prescribed: 5
Plan Total Prescribed Dose: 10 Gy
Reference Point Dosage Given to Date: 54.4 Gy
Reference Point Session Dosage Given: 2 Gy
Session Number: 30

## 2021-08-21 NOTE — Telephone Encounter (Signed)
Scheduled appointment per 5/15 los. Patient is aware. ?

## 2021-08-22 ENCOUNTER — Other Ambulatory Visit: Payer: Self-pay

## 2021-08-22 ENCOUNTER — Ambulatory Visit
Admission: RE | Admit: 2021-08-22 | Discharge: 2021-08-22 | Disposition: A | Discharge status: Home / Self Care | Payer: PPO | Source: Ambulatory Visit | Attending: Radiation Oncology | Admitting: Radiation Oncology

## 2021-08-22 DIAGNOSIS — C50412 Malignant neoplasm of upper-outer quadrant of left female breast: Secondary | ICD-10-CM | POA: Diagnosis not present

## 2021-08-22 DIAGNOSIS — Z510 Encounter for antineoplastic radiation therapy: Secondary | ICD-10-CM | POA: Diagnosis not present

## 2021-08-22 LAB — RAD ONC ARIA SESSION SUMMARY
Course Elapsed Days: 42
Plan Fractions Treated to Date: 3
Plan Prescribed Dose Per Fraction: 2 Gy
Plan Total Fractions Prescribed: 5
Plan Total Prescribed Dose: 10 Gy
Reference Point Dosage Given to Date: 56.4 Gy
Reference Point Session Dosage Given: 2 Gy
Session Number: 31

## 2021-08-23 ENCOUNTER — Other Ambulatory Visit: Payer: Self-pay

## 2021-08-23 ENCOUNTER — Ambulatory Visit
Admission: RE | Admit: 2021-08-23 | Discharge: 2021-08-23 | Disposition: A | Discharge status: Home / Self Care | Payer: PPO | Source: Ambulatory Visit | Attending: Radiation Oncology | Admitting: Radiation Oncology

## 2021-08-23 ENCOUNTER — Encounter: Payer: Self-pay | Admitting: *Deleted

## 2021-08-23 DIAGNOSIS — C50412 Malignant neoplasm of upper-outer quadrant of left female breast: Secondary | ICD-10-CM | POA: Diagnosis not present

## 2021-08-23 DIAGNOSIS — Z510 Encounter for antineoplastic radiation therapy: Secondary | ICD-10-CM | POA: Diagnosis not present

## 2021-08-23 DIAGNOSIS — Z170 Estrogen receptor positive status [ER+]: Secondary | ICD-10-CM

## 2021-08-23 LAB — RAD ONC ARIA SESSION SUMMARY
Course Elapsed Days: 43
Plan Fractions Treated to Date: 4
Plan Prescribed Dose Per Fraction: 2 Gy
Plan Total Fractions Prescribed: 5
Plan Total Prescribed Dose: 10 Gy
Reference Point Dosage Given to Date: 58.4 Gy
Reference Point Session Dosage Given: 2 Gy
Session Number: 32

## 2021-08-24 ENCOUNTER — Ambulatory Visit
Admission: RE | Admit: 2021-08-24 | Discharge: 2021-08-24 | Disposition: A | Discharge status: Home / Self Care | Payer: PPO | Source: Ambulatory Visit | Attending: Radiation Oncology | Admitting: Radiation Oncology

## 2021-08-24 ENCOUNTER — Encounter: Payer: Self-pay | Admitting: Radiation Oncology

## 2021-08-24 ENCOUNTER — Other Ambulatory Visit: Payer: Self-pay

## 2021-08-24 DIAGNOSIS — C50412 Malignant neoplasm of upper-outer quadrant of left female breast: Secondary | ICD-10-CM | POA: Diagnosis not present

## 2021-08-24 DIAGNOSIS — Z170 Estrogen receptor positive status [ER+]: Secondary | ICD-10-CM | POA: Diagnosis not present

## 2021-08-24 DIAGNOSIS — Z510 Encounter for antineoplastic radiation therapy: Secondary | ICD-10-CM | POA: Diagnosis not present

## 2021-08-24 LAB — RAD ONC ARIA SESSION SUMMARY
Course Elapsed Days: 44
Plan Fractions Treated to Date: 5
Plan Prescribed Dose Per Fraction: 2 Gy
Plan Total Fractions Prescribed: 5
Plan Total Prescribed Dose: 10 Gy
Reference Point Dosage Given to Date: 60.4 Gy
Reference Point Session Dosage Given: 2 Gy
Session Number: 33

## 2021-08-30 NOTE — Progress Notes (Signed)
                                                                                                                                                             Patient Name: Loretta Griffin MRN: [id number] DOB: 1950/08/05 Referring Physician: Nicholas Lose (Profile Not Attached) Date of Service: 08/24/2021 [facility] [place]                                                        End Of Treatment Note  Diagnoses: C50.412-Malignant neoplasm of upper-outer quadrant of left female breast  Cancer Staging: Stage IA, cT1aN0M0, grade 1, ER/PR positive invasive ductal carcinoma of the left breast with no residual invasive component at the time of surgery  Intent: Curative  Radiation Treatment Dates: 07/11/2021 through 08/24/2021 Site Technique Total Dose (Gy) Dose per Fx (Gy) Completed Fx Beam Energies  Breast, Left: Breast_L 3D 50.4/50.4 1.8 28/28 6X  Breast, Left: Breast_L_Bst specialPort 10/10 2 5/5 12E   Narrative: The patient tolerated radiation therapy relatively well. She developed fatigue and anticipated skin changes in the treatment field.   Plan: The patient will receive a call in about one month from the radiation oncology department. She will continue follow up with Dr. Lindi Adie as well.   ________________________________________________    Carola Rhine, [facility]

## 2021-10-01 ENCOUNTER — Ambulatory Visit
Admission: RE | Admit: 2021-10-01 | Discharge: 2021-10-01 | Disposition: A | Discharge status: Home / Self Care | Payer: PPO | Source: Ambulatory Visit | Attending: Radiation Oncology | Admitting: Radiation Oncology

## 2021-10-01 DIAGNOSIS — C50412 Malignant neoplasm of upper-outer quadrant of left female breast: Secondary | ICD-10-CM | POA: Insufficient documentation

## 2021-10-01 DIAGNOSIS — Z170 Estrogen receptor positive status [ER+]: Secondary | ICD-10-CM | POA: Insufficient documentation

## 2021-10-01 NOTE — Progress Notes (Signed)
  Radiation Oncology         (336) [phone] ________________________________  Name: Loretta Griffin MRN: [id number]  Date of Service: 10/01/2021  DOB: [birth year]  Post Treatment Telephone Note  Diagnosis:   Stage IA, cT1aN0M0, grade 1, ER/PR positive invasive ductal carcinoma of the left breast with no residual invasive component at the time of surgery  Intent: Curative  Radiation Treatment Dates: 07/11/2021 through 08/24/2021 Site Technique Total Dose (Gy) Dose per Fx (Gy) Completed Fx Beam Energies  Breast, Left: Breast_L 3D 50.4/50.4 1.8 28/28 6X  Breast, Left: Breast_L_Bst specialPort 10/10 2 5/5 12E   Narrative: The patient tolerated radiation therapy relatively well. She developed fatigue and anticipated skin changes in the treatment field. her     Impression/Plan: 1. Stage IA, cT1aN0M0, grade 1, ER/PR positive invasive ductal carcinoma of the left breast with no residual invasive component at the time of surgery. The patient has been doing well since completion of radiotherapy. We discussed that we would be happy to continue to follow her as needed, but she will also continue to follow up with Dr. Pamelia Hoit in medical oncology. She was counseled on skin care as well as measures to avoid sun exposure to this area.  2. Survivorship. We discussed the importance of survivorship evaluation and encouraged her to attend her upcoming visit with that clinic.      Osker Mason, PAC

## 2021-11-01 DIAGNOSIS — H401131 Primary open-angle glaucoma, bilateral, mild stage: Secondary | ICD-10-CM | POA: Diagnosis not present

## 2021-11-04 NOTE — Progress Notes (Unsigned)
SURVIVORSHIP VISIT:  BRIEF ONCOLOGIC HISTORY:  Oncology History  Malignant neoplasm of upper-outer quadrant of left breast in female, estrogen receptor positive ([place])  04/25/2021 Initial Diagnosis   Screening mammogram detected left breast mass UOQ 2:00: 0.7 cm: Ultrasound biopsy: Benign concordant; additional 0.3 cm distortion: Stereotactic biopsy: Grade 1 IDC ER 100%, PR 100%, HER2 negative, Ki-67 1%   05/09/2021 Cancer Staging   Staging form: Breast, AJCC 8th Edition - Clinical stage from 05/09/2021: Stage IA (cT1a, cN0, cM0, G1, ER+, PR+, HER2-) - Signed by Nicholas Lose, MD on 05/09/2021 Stage prefix: Initial diagnosis Histologic grading system: 3 grade system   05/21/2021 Genetic Testing   Negative hereditary cancer genetic testing: no pathogenic variants detected in [facility] CancerNext-Expanded +RNAinsight Panel.  Report date is 05/21/2021.   The CancerNext-Expanded gene panel offered by [facility] and includes sequencing, rearrangement, and RNA analysis for the following 77 genes: AIP, ALK, APC, ATM, AXIN2, BAP1, BARD1, BLM, BMPR1A, BRCA1, BRCA2, BRIP1, CDC73, CDH1, CDK4, CDKN1B, CDKN2A, CHEK2, CTNNA1, DICER1, FANCC, FH, FLCN, GALNT12, KIF1B, LZTR1, MAX, MEN1, MET, MLH1, MSH2, MSH3, MSH6, MUTYH, NBN, NF1, NF2, NTHL1, PALB2, PHOX2B, PMS2, POT1, PRKAR1A, PTCH1, PTEN, RAD51C, RAD51D, RB1, RECQL, RET, SDHA, SDHAF2, SDHB, SDHC, SDHD, SMAD4, SMARCA4, SMARCB1, SMARCE1, STK11, SUFU, TMEM127, TP53, TSC1, TSC2, VHL and XRCC2 (sequencing and deletion/duplication); EGFR, EGLN1, HOXB13, KIT, MITF, PDGFRA, POLD1, and POLE (sequencing only); EPCAM and GREM1 (deletion/duplication only).    06/08/2021 Surgery   Left lumpectomy: Focal DCIS, no residual invasive cancer, margins negative, ER 100%, PR 100%, HER2 negative, Ki-67 1% (tumor size 2.5 mm based on the biopsy specimen)   07/11/2021 - 08/24/2021 Radiation Therapy   Site Technique Total Dose (Gy) Dose per Fx (Gy) Completed Fx Beam Energies  Breast, Left:  Breast_L 3D 50.4/50.4 1.8 28/28 6X  Breast, Left: Breast_L_Bst specialPort 10/10 2 5/5 12E     09/06/2021 -  Anti-estrogen oral therapy   Letrozole x 5-7 years     INTERVAL HISTORY:  Ms. Loretta Griffin to review her survivorship care plan detailing her treatment course for breast cancer, as well as monitoring long-term side effects of that treatment, education regarding health maintenance, screening, and overall wellness and health promotion.     Overall, Ms. Loretta Griffin reports feeling quite well.  She is taking Letrozole daily and tolerates it moderately well   REVIEW OF SYSTEMS:  Review of Systems  Constitutional:  Negative for appetite change, chills, fatigue, fever and unexpected weight change.  HENT:   Negative for hearing loss, lump/mass and trouble swallowing.   Eyes:  Negative for eye problems and icterus.  Respiratory:  Negative for chest tightness, cough and shortness of breath.   Cardiovascular:  Negative for chest pain, leg swelling and palpitations.  Gastrointestinal:  Negative for abdominal distention, abdominal pain, constipation, diarrhea, nausea and vomiting.  Endocrine: Negative for hot flashes.  Genitourinary:  Negative for difficulty urinating.   Musculoskeletal:  Negative for arthralgias.  Skin:  Negative for itching and rash.  Neurological:  Negative for dizziness, extremity weakness, headaches and numbness.  Hematological:  Negative for adenopathy. Does not bruise/bleed easily.  Psychiatric/Behavioral:  Negative for depression. The patient is not nervous/anxious.   Breast: Denies any new nodularity, masses, tenderness, nipple changes, or nipple discharge.    ONCOLOGY TREATMENT TEAM:  1. Surgeon:  Dr. Marlou Starks at [facility] 2. Medical Oncologist: Dr. Lindi Adie  3. Radiation Oncologist: Dr. Lisbeth Renshaw    PAST MEDICAL/SURGICAL HISTORY:  Past Medical History:  Diagnosis Date   Breast cancer ([place])  Complication of anesthesia    Family history of breast cancer  05/10/2021   Past Surgical History:  Procedure Laterality Date   BREAST LUMPECTOMY WITH RADIOACTIVE SEED LOCALIZATION Left 06/08/2021   Procedure: LEFT BREAST LUMPECTOMY WITH RADIOACTIVE SEED LOCALIZATION;  Surgeon: Jovita Kussmaul, MD;  Location: [facility];  Service: General;  Laterality: Left;   cataract surgery     eye lid lift     left knee arthroscopy     left lumpectomy     PLACEMENT OF BREAST IMPLANTS       ALLERGIES:  Allergies  Allergen Reactions   Bimatoprost    Brimonidine Tartrate-Timolol    Brinzolamide-Brimonidine    Codeine Nausea And Vomiting    vomits   Rhopressa [Netarsudil Dimesylate]      CURRENT MEDICATIONS:  Outpatient Encounter Medications as of 11/05/2021  Medication Sig   ALPHAGAN P 0.1 % SOLN    Ascorbic Acid (VITAMIN C) 1000 MG tablet 1 tablet   Cholecalciferol (D3) 50 MCG (2000 UT) TABS Take by mouth.   Glucosamine-Chondroit-Vit C-Mn (GLUCOSAMINE 1500 COMPLEX PO) Take by mouth.   letrozole (FEMARA) 2.5 MG tablet Take 1 tablet (2.5 mg total) by mouth daily.   Multiple Vitamins-Minerals (PRESERVISION AREDS 2+MULTI VIT PO) Take by mouth.   Omega-3 Fatty Acids (FISH OIL) 1000 MG CAPS Take 1 capsule by mouth daily.   RESTASIS MULTIDOSE 0.05 % ophthalmic emulsion INT 1 GTT INTO OU BID   Turmeric 500 MG CAPS Take 1,500 mg by mouth daily.   vitamin B-12 (CYANOCOBALAMIN) 500 MCG tablet Take 5,000 mcg by mouth daily.   vitamin E 180 MG (400 UNITS) capsule Take 400 Units by mouth daily.   diphenhydrAMINE (BENADRYL) 25 mg capsule Take 25 mg by mouth every 6 (six) hours as needed. (Patient not taking: Reported on 11/05/2021)   [DISCONTINUED] Boswellia Serrata Extract POWD 500 mg by Does not apply route daily. (Patient not taking: Reported on 11/05/2021)   No facility-administered encounter medications on file as of 11/05/2021.     ONCOLOGIC FAMILY HISTORY:  Family History  Problem Relation Age of Onset   Breast cancer Mother        dx 74; dx  68; two primaries; mets   Lung cancer Maternal Uncle        dx after 15; smoking hx   Breast cancer Paternal Aunt        dx 55s     SOCIAL HISTORY:  Social History   Socioeconomic History   Marital status: Single    Spouse name: Not on file   Number of children: Not on file   Years of education: Not on file   Highest education level: Not on file  Occupational History   Occupation: retired Therapist, sports  Tobacco Use   Smoking status: Never   Smokeless tobacco: Never  Vaping Use   Vaping Use: Never used  Substance and Sexual Activity   Alcohol use: No   Drug use: Never   Sexual activity: Not on file  Other Topics Concern   Not on file  Social History Narrative   Not on file   Social Determinants of Health   Financial Resource Strain: Not on file  Food Insecurity: Not on file  Transportation Needs: Not on file  Physical Activity: Not on file  Stress: Not on file  Social Connections: Not on file  Intimate Partner Violence: Not on file     OBSERVATIONS/OBJECTIVE BP (!) 145/87 (BP Location: Left Arm, Patient Position: Sitting)  Pulse 65   Temp 97.7 F (36.5 C) (Temporal)   Resp 16   Ht 5' 9" (1.753 m)   Wt 194 lb 3.2 oz (88.1 kg)   SpO2 97%   BMI 28.68 kg/m  GENERAL: Patient is a well appearing female in no acute distress HEENT:  Sclerae anicteric.  Oropharynx clear and moist. No ulcerations or evidence of oropharyngeal candidiasis. Neck is supple.  NODES:  No cervical, supraclavicular, or axillary lymphadenopathy palpated.  BREAST EXAM:  Deferred. LUNGS:  Clear to auscultation bilaterally.  No wheezes or rhonchi. HEART:  Regular rate and rhythm. No murmur appreciated. ABDOMEN:  Soft, nontender.  Positive, normoactive bowel sounds. No organomegaly palpated. MSK:  No focal spinal tenderness to palpation. Full range of motion bilaterally in the upper extremities. EXTREMITIES:  No peripheral edema.   SKIN:  Clear with no obvious rashes or skin changes. No nail  dyscrasia. NEURO:  Nonfocal. Well oriented.  Appropriate affect.   LABORATORY DATA:  None for this visit.  DIAGNOSTIC IMAGING:  None for this visit.      ASSESSMENT AND PLAN:  Ms.. Loretta Griffin is a pleasant 71 y.o. female with Stage IA left breast invasive ductal carcinoma, ER+/PR+/HER2-, diagnosed in 04/2021, treated with lumpectomy, adjuvant radiation therapy, and anti-estrogen therapy with Letrozolr beginning in 09/2021.  She presents to the Survivorship Clinic for our initial meeting and routine follow-up post-completion of treatment for breast cancer.    1. Stage IA left breast cancer:  Ms. Marcott is continuing to recover from definitive treatment for breast cancer. She will follow-up with her medical oncologist, Dr. Lindi Adie in 6 months with history and physical exam per surveillance protocol.  She will continue her anti-estrogen therapy with letrozole. Thus far, she is tolerating the letrozole well, with minimal side effects. She was instructed to make Dr. Lindi Adie or myself aware if she begins to experience any worsening side effects of the medication and I could see her back in clinic to help manage those side effects, as needed. Her mammogram is due January 2024; orders placed today. Today, a comprehensive survivorship care plan and treatment summary was reviewed with the patient today detailing her breast cancer diagnosis, treatment course, potential late/long-term effects of treatment, appropriate follow-up care with recommendations for the future, and patient education resources.  A copy of this summary, along with a letter will be sent to the patient's primary care provider via mail/fax/In Basket message after today's visit.    2. Bone health:  Given Ms. Vaquera's age/history of breast cancer and her current treatment regimen including anti-estrogen therapy with Letrozole, she is at risk for bone demineralization.  Her last DEXA scan was 09/15/2020, which showed osteopenia with a t score of  -2.3 in the right femur.She was given education on specific activities to promote bone health.  I recommend repeat bone density testing in June 2024.  3. Cancer screening:  Due to Ms. Schiano's history and her age, she should receive screening for skin cancers, colon cancer, and gynecologic cancers.  The information and recommendations are listed on the patient's comprehensive care plan/treatment summary and were reviewed in detail with the patient.    4. Health maintenance and wellness promotion: Ms. Naeve was encouraged to consume 5-7 servings of fruits and vegetables per day. We reviewed the "Nutrition Rainbow" handout.  She was also encouraged to engage in moderate to vigorous exercise for 30 minutes per day most days of the week. We discussed the [employer] fitness program, which is designed for cancer  survivors to help them become more physically fit after cancer treatments.  She was instructed to limit her alcohol consumption and continue to abstain from tobacco use.     5. Support services/counseling: It is not uncommon for this period of the patient's cancer care trajectory to be one of many emotions and stressors.  She was given information regarding our available services and encouraged to contact me with any questions or for help enrolling in any of our support group/programs.    Follow up instructions:    -Return to cancer center 05/2022  -Mammogram due in 04/2022 -DEXA in 09/2022 -Follow up with surgery 11/2021 -She is welcome to return back to the Survivorship Clinic at any time; no additional follow-up needed at this time.  -Consider referral back to survivorship as a long-term survivor for continued surveillance  The patient was provided an opportunity to ask questions and all were answered. The patient agreed with the plan and demonstrated an understanding of the instructions.   Total encounter time:30 minutes*in face-to-face visit time, chart review, lab review, care  coordination, order entry, and documentation of the encounter time.    Wilber Bihari, NP 11/07/21 1:38 PM Medical Oncology and Hematology [facility] [place], [place] [address] Tel. [phone]    Fax. [phone]  *Total Encounter Time as defined by the Centers for Medicare and Medicaid Services includes, in addition to the face-to-face time of a patient visit (documented in the note above) non-face-to-face time: obtaining and reviewing outside history, ordering and reviewing medications, tests or procedures, care coordination (communications with other health care professionals or caregivers) and documentation in the medical record.

## 2021-11-05 ENCOUNTER — Other Ambulatory Visit: Payer: Self-pay

## 2021-11-05 ENCOUNTER — Encounter: Payer: Self-pay | Admitting: Adult Health

## 2021-11-05 ENCOUNTER — Inpatient Hospital Stay: Payer: PPO | Attending: Hematology and Oncology | Admitting: Adult Health

## 2021-11-05 VITALS — BP 145/87 | HR 65 | Temp 97.7°F | Resp 16 | Ht 69.0 in | Wt 194.2 lb

## 2021-11-05 DIAGNOSIS — E210 Primary hyperparathyroidism: Secondary | ICD-10-CM | POA: Insufficient documentation

## 2021-11-05 DIAGNOSIS — E2839 Other primary ovarian failure: Secondary | ICD-10-CM

## 2021-11-05 DIAGNOSIS — C50412 Malignant neoplasm of upper-outer quadrant of left female breast: Secondary | ICD-10-CM

## 2021-11-05 DIAGNOSIS — Z170 Estrogen receptor positive status [ER+]: Secondary | ICD-10-CM

## 2021-11-05 DIAGNOSIS — E7800 Pure hypercholesterolemia, unspecified: Secondary | ICD-10-CM | POA: Insufficient documentation

## 2021-11-05 DIAGNOSIS — Z79811 Long term (current) use of aromatase inhibitors: Secondary | ICD-10-CM | POA: Diagnosis not present

## 2021-11-05 DIAGNOSIS — R7303 Prediabetes: Secondary | ICD-10-CM | POA: Insufficient documentation

## 2021-11-22 ENCOUNTER — Encounter: Payer: PPO | Admitting: Adult Health

## 2022-03-26 IMAGING — DX MM BREAST SURGICAL SPECIMEN
1 series · 2 of 2 positions shown · non-contrast
Comparison: Previous exam(s).

CLINICAL DATA: Post lumpectomy specimen radiograph

EXAM:
SPECIMEN RADIOGRAPH OF THE LEFT BREAST

[Series 1: specimen digital x-ray · left · 0.07mm/px · 2 of 2 slices shown]
[im 1/2]
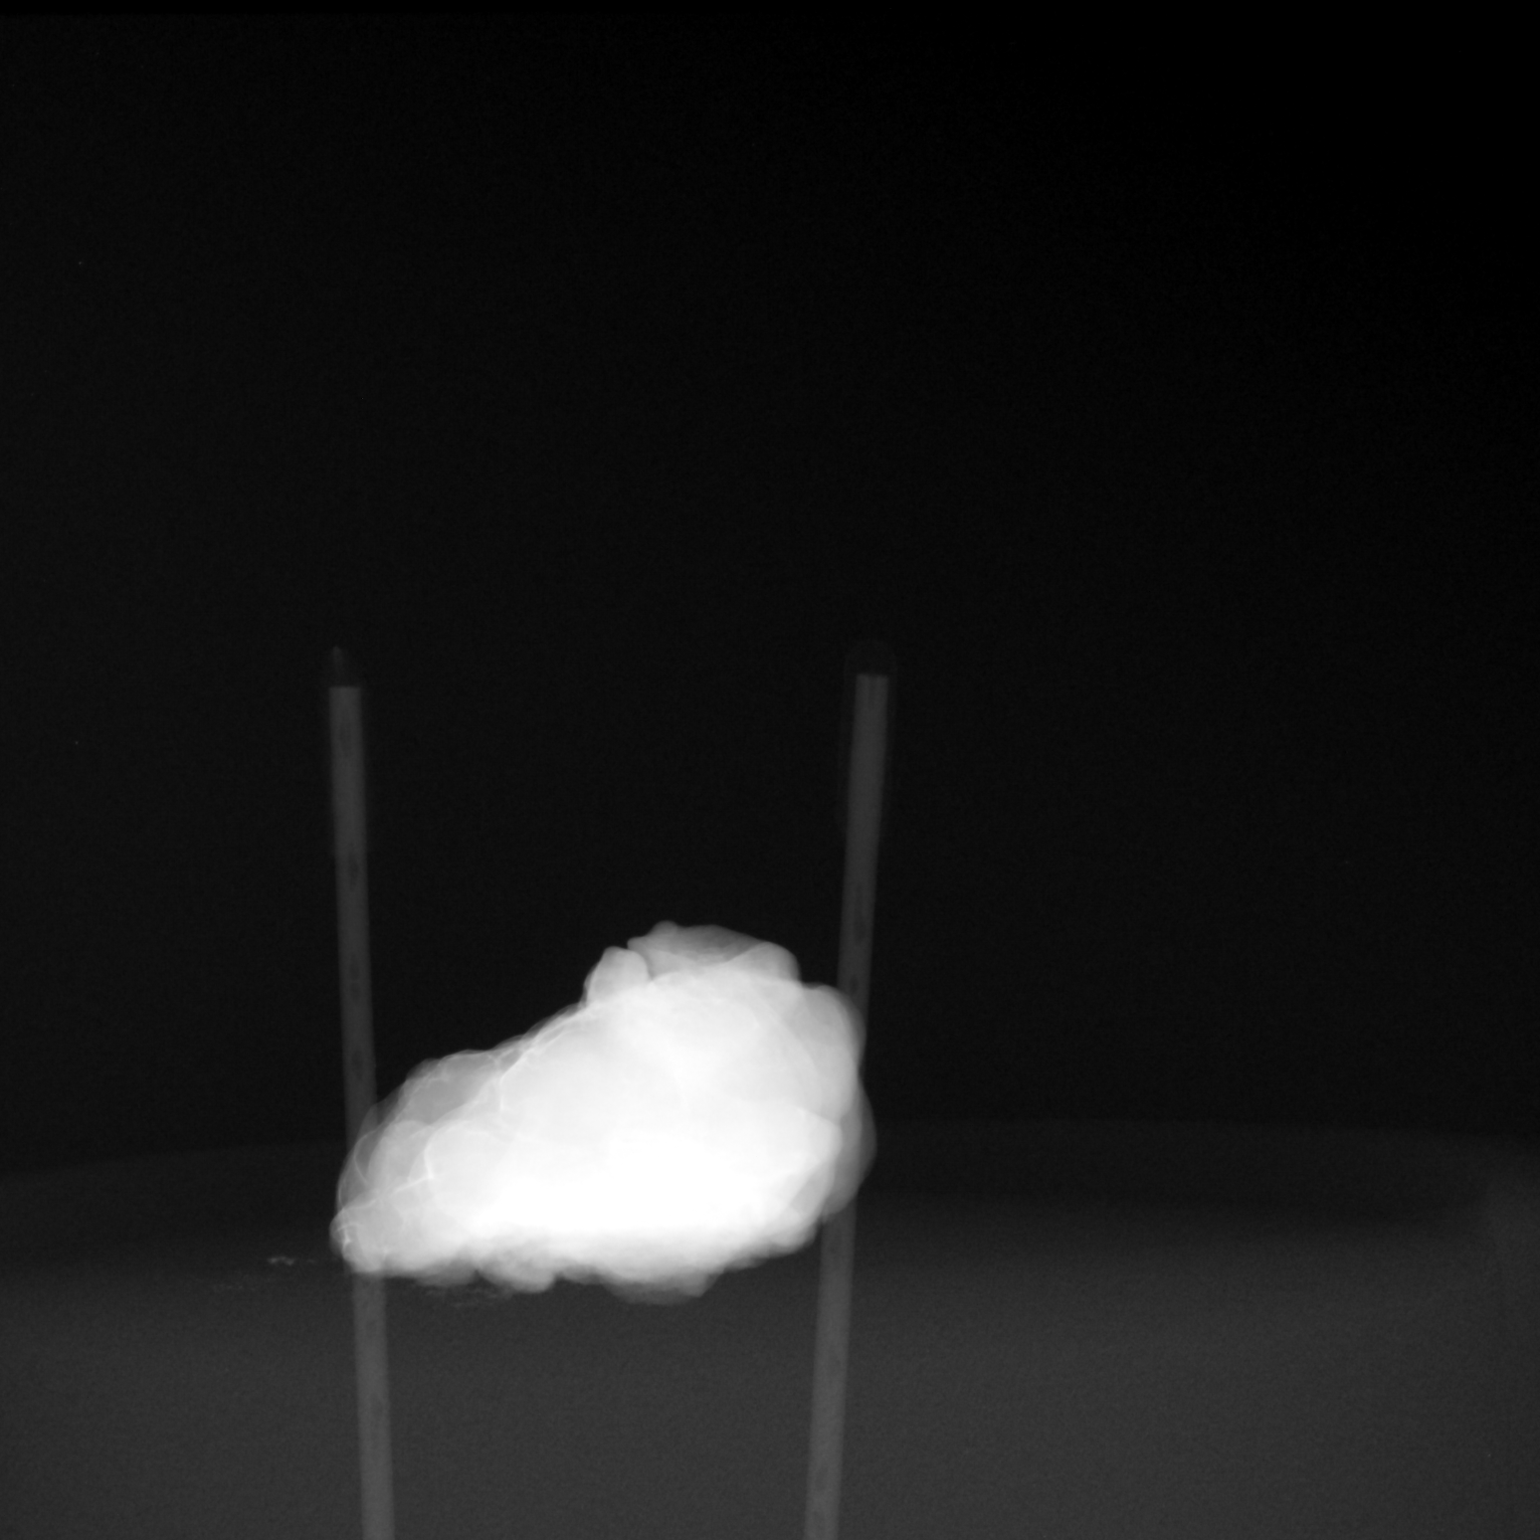
[im 2/2]
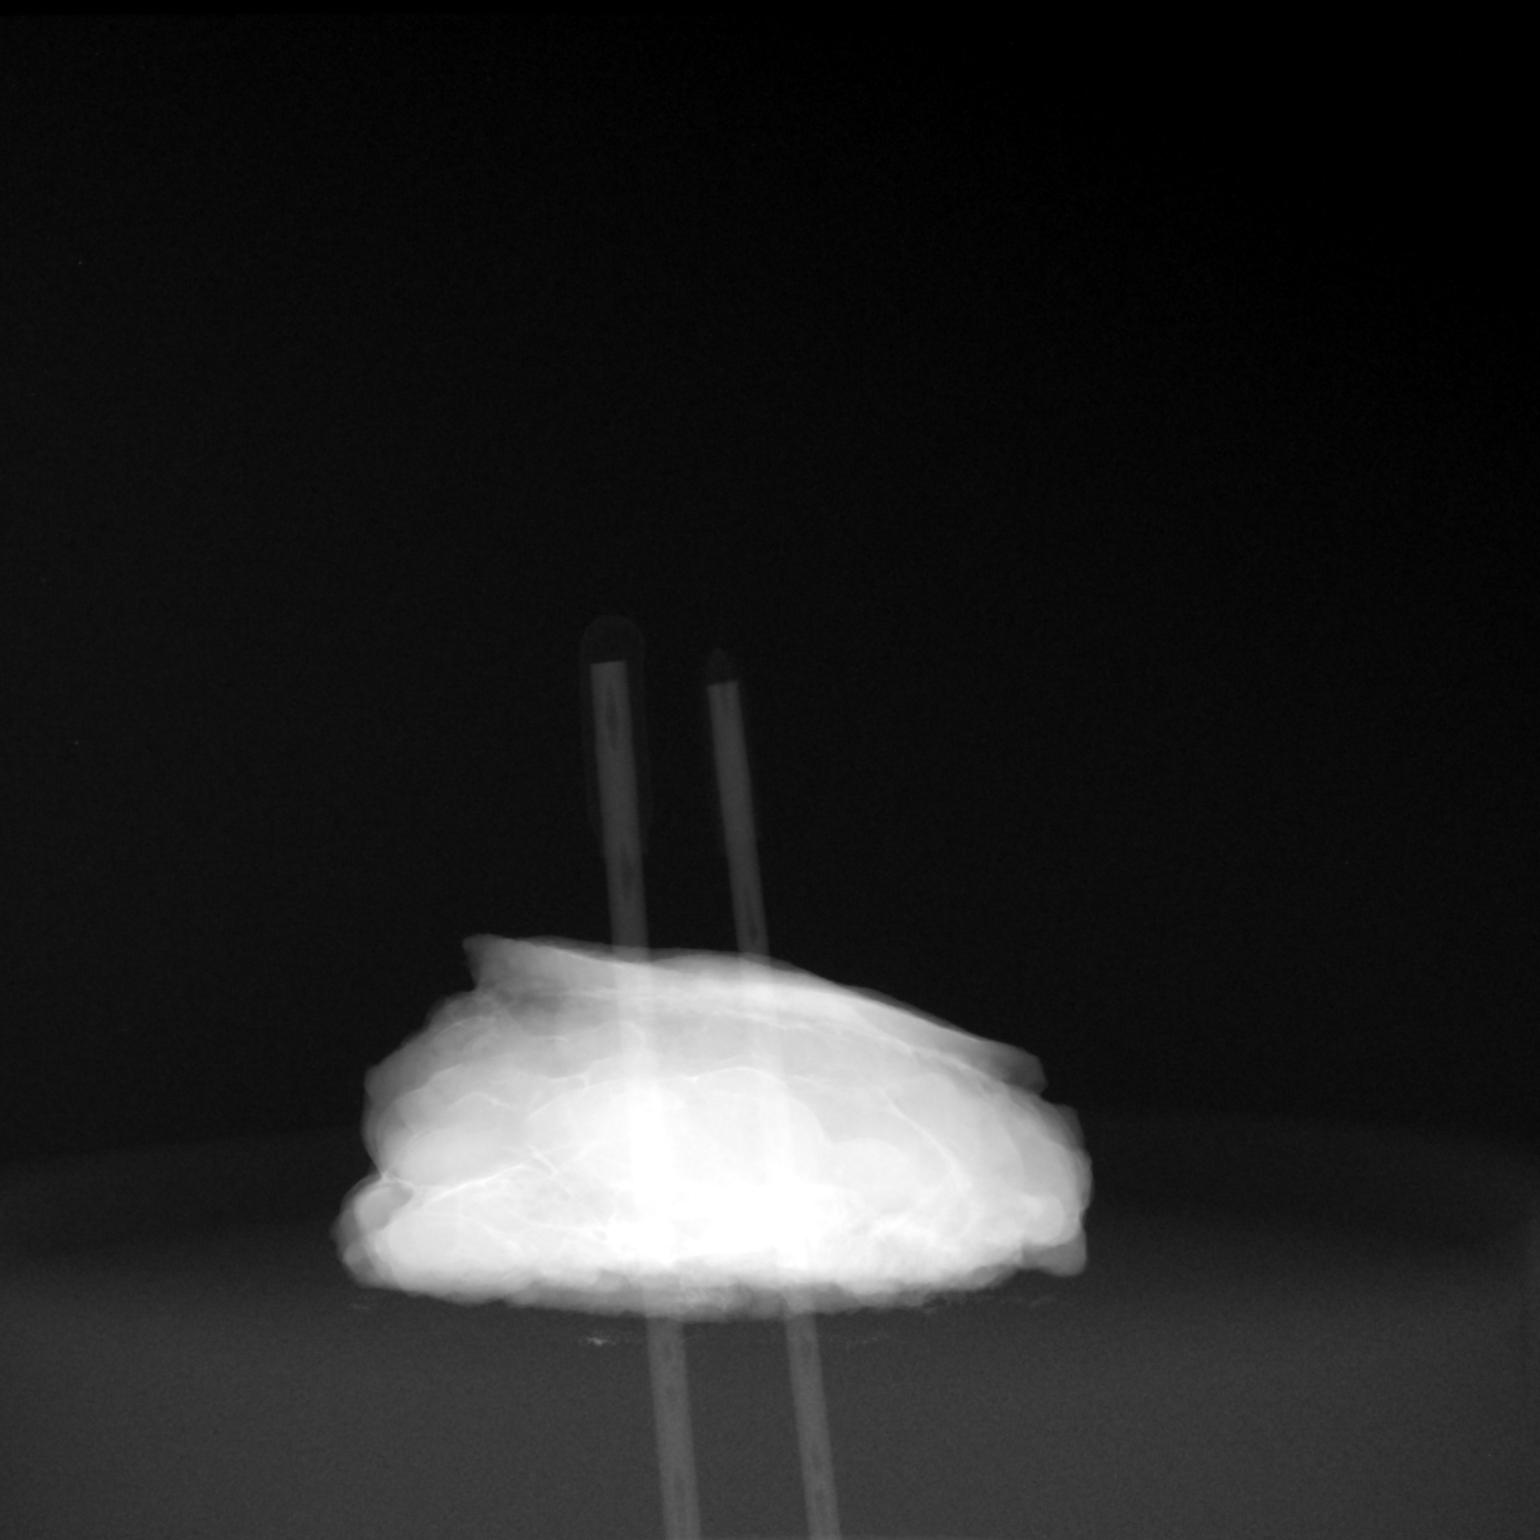

[2 of 2 positions shown; findings below may reference images not displayed]

FINDINGS: Status post excision of the left breast. The radioactive seed and
biopsy marker clip are present, completely intact, and were marked
for pathology.
IMPRESSION: Specimen radiograph of the left breast.

These results were called by telephone at the time of interpretation
on 06/08/2021 at [DATE] to provider MATHAUS PADIAL , who verbally
acknowledged these results.

## 2022-03-28 ENCOUNTER — Ambulatory Visit
Admission: RE | Admit: 2022-03-28 | Discharge: 2022-03-28 | Disposition: A | Discharge status: Home / Self Care | Payer: PPO | Source: Ambulatory Visit | Attending: Adult Health | Admitting: Adult Health

## 2022-03-28 DIAGNOSIS — C50412 Malignant neoplasm of upper-outer quadrant of left female breast: Secondary | ICD-10-CM

## 2022-03-28 DIAGNOSIS — R922 Inconclusive mammogram: Secondary | ICD-10-CM | POA: Diagnosis not present

## 2022-03-28 HISTORY — DX: Personal history of irradiation: Z92.3

## 2022-04-03 DIAGNOSIS — Z23 Encounter for immunization: Secondary | ICD-10-CM | POA: Diagnosis not present

## 2022-04-11 DIAGNOSIS — E7800 Pure hypercholesterolemia, unspecified: Secondary | ICD-10-CM | POA: Diagnosis not present

## 2022-04-11 DIAGNOSIS — R7301 Impaired fasting glucose: Secondary | ICD-10-CM | POA: Diagnosis not present

## 2022-04-11 DIAGNOSIS — R7309 Other abnormal glucose: Secondary | ICD-10-CM | POA: Diagnosis not present

## 2022-04-15 DIAGNOSIS — Z0000 Encounter for general adult medical examination without abnormal findings: Secondary | ICD-10-CM | POA: Diagnosis not present

## 2022-04-15 DIAGNOSIS — R7303 Prediabetes: Secondary | ICD-10-CM | POA: Diagnosis not present

## 2022-04-15 DIAGNOSIS — M8580 Other specified disorders of bone density and structure, unspecified site: Secondary | ICD-10-CM | POA: Diagnosis not present

## 2022-04-15 DIAGNOSIS — E7800 Pure hypercholesterolemia, unspecified: Secondary | ICD-10-CM | POA: Diagnosis not present

## 2022-04-15 DIAGNOSIS — Z1211 Encounter for screening for malignant neoplasm of colon: Secondary | ICD-10-CM | POA: Diagnosis not present

## 2022-05-08 ENCOUNTER — Inpatient Hospital Stay: Payer: PPO | Attending: Hematology and Oncology | Admitting: Hematology and Oncology

## 2022-05-08 VITALS — BP 155/91 | HR 73 | Temp 97.2°F | Resp 16 | Wt 196.0 lb

## 2022-05-08 DIAGNOSIS — C50412 Malignant neoplasm of upper-outer quadrant of left female breast: Secondary | ICD-10-CM | POA: Diagnosis not present

## 2022-05-08 DIAGNOSIS — Z79811 Long term (current) use of aromatase inhibitors: Secondary | ICD-10-CM | POA: Insufficient documentation

## 2022-05-08 DIAGNOSIS — Z170 Estrogen receptor positive status [ER+]: Secondary | ICD-10-CM

## 2022-05-08 MED ORDER — LETROZOLE 2.5 MG PO TABS: 2.5000 mg | ORAL_TABLET | Freq: Every day | ORAL | 3 refills | Status: DC

## 2022-05-08 NOTE — Assessment & Plan Note (Addendum)
06/08/2021:Left lumpectomy: Focal DCIS, no residual invasive cancer, margins negative, ER 100%, PR 100%, HER2 negative, Ki-67 1% (tumor size 2.5 mm based on the biopsy specimen)   Treatment plan: 1.  Adjuvant radiation therapy 07/12/2021-08/24/2021 2. adjuvant antiestrogen therapy with letrozole x5 years to start 09/06/2021   Letrozole toxicities: Mild hot flashes Joint stiffness but they got better after taking turmeric  Breast cancer surveillance: Mammogram 03/28/2022: Benign breast density category C Bone density has been ordered for February 2024.  Return to clinic in 1 year for follow-up

## 2022-05-08 NOTE — Progress Notes (Signed)
Patient Care Team: Glenis Smoker, MD as PCP - General (Family Medicine) Jovita Kussmaul, MD as Consulting Physician (General Surgery) Nicholas Lose, MD as Consulting Physician (Hematology and Oncology) Kyung Rudd, MD as Consulting Physician (Radiation Oncology)  DIAGNOSIS:  Encounter Diagnosis  Name Primary?   Malignant neoplasm of upper-outer quadrant of left breast in female, estrogen receptor positive ([place]) Yes    SUMMARY OF ONCOLOGIC HISTORY: Oncology History  Malignant neoplasm of upper-outer quadrant of left breast in female, estrogen receptor positive ([place])  04/25/2021 Initial Diagnosis   Screening mammogram detected left breast mass UOQ 2:00: 0.7 cm: Ultrasound biopsy: Benign concordant; additional 0.3 cm distortion: Stereotactic biopsy: Grade 1 IDC ER 100%, PR 100%, HER2 negative, Ki-67 1%   05/09/2021 Cancer Staging   Staging form: Breast, AJCC 8th Edition - Clinical stage from 05/09/2021: Stage IA (cT1a, cN0, cM0, G1, ER+, PR+, HER2-) - Signed by Nicholas Lose, MD on 05/09/2021 Stage prefix: Initial diagnosis Histologic grading system: 3 grade system   05/21/2021 Genetic Testing   Negative hereditary cancer genetic testing: no pathogenic variants detected in [facility] CancerNext-Expanded +RNAinsight Panel.  Report date is 05/21/2021.   The CancerNext-Expanded gene panel offered by [facility] and includes sequencing, rearrangement, and RNA analysis for the following 77 genes: AIP, ALK, APC, ATM, AXIN2, BAP1, BARD1, BLM, BMPR1A, BRCA1, BRCA2, BRIP1, CDC73, CDH1, CDK4, CDKN1B, CDKN2A, CHEK2, CTNNA1, DICER1, FANCC, FH, FLCN, GALNT12, KIF1B, LZTR1, MAX, MEN1, MET, MLH1, MSH2, MSH3, MSH6, MUTYH, NBN, NF1, NF2, NTHL1, PALB2, PHOX2B, PMS2, POT1, PRKAR1A, PTCH1, PTEN, RAD51C, RAD51D, RB1, RECQL, RET, SDHA, SDHAF2, SDHB, SDHC, SDHD, SMAD4, SMARCA4, SMARCB1, SMARCE1, STK11, SUFU, TMEM127, TP53, TSC1, TSC2, VHL and XRCC2 (sequencing and deletion/duplication); EGFR, EGLN1, HOXB13, KIT,  MITF, PDGFRA, POLD1, and POLE (sequencing only); EPCAM and GREM1 (deletion/duplication only).    06/08/2021 Surgery   Left lumpectomy: Focal DCIS, no residual invasive cancer, margins negative, ER 100%, PR 100%, HER2 negative, Ki-67 1% (tumor size 2.5 mm based on the biopsy specimen)   07/11/2021 - 08/24/2021 Radiation Therapy   Site Technique Total Dose (Gy) Dose per Fx (Gy) Completed Fx Beam Energies  Breast, Left: Breast_L 3D 50.4/50.4 1.8 28/28 6X  Breast, Left: Breast_L_Bst specialPort 10/10 2 5/5 12E     09/06/2021 -  Anti-estrogen oral therapy   Letrozole x 5-7 years     CHIEF COMPLIANT: Breast cancer follow-up on letrozole  INTERVAL HISTORY: Loretta Griffin is a 72 y.o. female is here  with invasive ductal carcinoma of left breast. Currently on letrozole. She presents to the clinic today for a follow-up. She reports she on has mild hot flashes. She had some joint stiffness when she was off tumeric. She denies any pain or discomfort in breast. She says she walks 3.3 miles a day.  ALLERGIES:  is allergic to bimatoprost, brimonidine tartrate-timolol, brinzolamide-brimonidine, codeine, and rhopressa [netarsudil dimesylate].  MEDICATIONS:  Current Outpatient Medications  Medication Sig Dispense Refill   ALPHAGAN P 0.1 % SOLN   1   Ascorbic Acid (VITAMIN C) 1000 MG tablet 1 tablet     Cholecalciferol (D3) 50 MCG (2000 UT) TABS Take by mouth.     diphenhydrAMINE (BENADRYL) 25 mg capsule Take 25 mg by mouth every 6 (six) hours as needed.     Glucosamine-Chondroit-Vit C-Mn (GLUCOSAMINE 1500 COMPLEX PO) Take by mouth.     letrozole (FEMARA) 2.5 MG tablet Take 1 tablet (2.5 mg total) by mouth daily. 90 tablet 3   Multiple Vitamins-Minerals (PRESERVISION AREDS 2+MULTI VIT PO) Take  by mouth.     Omega-3 Fatty Acids (FISH OIL) 1000 MG CAPS Take 1 capsule by mouth daily.     RESTASIS MULTIDOSE 0.05 % ophthalmic emulsion INT 1 GTT INTO OU BID  3   Turmeric 500 MG CAPS Take 1,500 mg by mouth  daily.     vitamin B-12 (CYANOCOBALAMIN) 500 MCG tablet Take 5,000 mcg by mouth daily.     vitamin E 180 MG (400 UNITS) capsule Take 400 Units by mouth daily.     No current facility-administered medications for this visit.    PHYSICAL EXAMINATION: ECOG PERFORMANCE STATUS: 1 - Symptomatic but completely ambulatory  Vitals:   05/08/22 1142  BP: (!) 155/91  Pulse: 73  Resp: 16  Temp: (!) 97.2 F (36.2 C)  SpO2: 94%   Filed Weights   05/08/22 1142  Weight: 196 lb (88.9 kg)      LABORATORY DATA:  I have reviewed the data as listed    Latest Ref Rng & Units 05/09/2021   12:36 PM  CMP  Glucose 70 - 99 mg/dL 98   BUN 8 - 23 mg/dL 20   Creatinine 0.44 - 1.00 mg/dL 0.90   Sodium 135 - 145 mmol/L 140   Potassium 3.5 - 5.1 mmol/L 4.3   Chloride 98 - 111 mmol/L 108   CO2 22 - 32 mmol/L 26   Calcium 8.9 - 10.3 mg/dL 10.4   Total Protein 6.5 - 8.1 g/dL 7.3   Total Bilirubin 0.3 - 1.2 mg/dL 0.3   Alkaline Phos 38 - 126 U/L 115   AST 15 - 41 U/L 12   ALT 0 - 44 U/L 10     Lab Results  Component Value Date   WBC 5.9 05/09/2021   HGB 13.6 05/09/2021   HCT 41.8 05/09/2021   MCV 89.5 05/09/2021   PLT 253 05/09/2021   NEUTROABS 3.7 05/09/2021    ASSESSMENT & PLAN:  Malignant neoplasm of upper-outer quadrant of left breast in female, estrogen receptor positive ([place]) 06/08/2021:Left lumpectomy: Focal DCIS, no residual invasive cancer, margins negative, ER 100%, PR 100%, HER2 negative, Ki-67 1% (tumor size 2.5 mm based on the biopsy specimen)   Treatment plan: 1.  Adjuvant radiation therapy 07/12/2021-08/24/2021 2. adjuvant antiestrogen therapy with letrozole x5 years to start 09/06/2021   Letrozole toxicities: Mild hot flashes Joint stiffness but they got better after taking turmeric  Breast cancer surveillance: Mammogram 03/28/2022: Benign breast density category C Bone density has been ordered for February 2024.  Return to clinic in 1 year for follow-up    No orders of  the defined types were placed in this encounter.  The patient has a good understanding of the overall plan. she agrees with it. she will call with any problems that may develop before the next visit here. Total time spent: 30 mins including face to face time and time spent for planning, charting and co-ordination of care   Harriette Ohara, MD 05/08/22    I Gardiner Coins am acting as a Education administrator for [employer]  I have reviewed the above documentation for accuracy and completeness, and I agree with the above.

## 2022-05-10 ENCOUNTER — Ambulatory Visit
Admission: RE | Admit: 2022-05-10 | Discharge: 2022-05-10 | Disposition: A | Discharge status: Home / Self Care | Payer: PPO | Source: Ambulatory Visit | Attending: Adult Health | Admitting: Adult Health

## 2022-05-10 DIAGNOSIS — E2839 Other primary ovarian failure: Secondary | ICD-10-CM

## 2022-05-10 DIAGNOSIS — Z780 Asymptomatic menopausal state: Secondary | ICD-10-CM | POA: Diagnosis not present

## 2022-05-10 DIAGNOSIS — M8589 Other specified disorders of bone density and structure, multiple sites: Secondary | ICD-10-CM | POA: Diagnosis not present

## 2022-05-15 ENCOUNTER — Telehealth: Payer: Self-pay

## 2022-05-15 NOTE — Telephone Encounter (Signed)
Called and left below message. Ask her to call the office back for questions. 

## 2022-05-15 NOTE — Telephone Encounter (Signed)
-----   Message from Gardenia Phlegm, NP sent at 05/15/2022  9:38 AM EST ----- Bone density shows osteopenia but is improved from 2 years ago which is great! ----- Message ----- From: Interface, Rad Results In Sent: 05/10/2022   2:47 PM EST To: Gardenia Phlegm, NP

## 2022-07-01 ENCOUNTER — Ambulatory Visit: Payer: PPO | Admitting: Internal Medicine

## 2022-07-08 ENCOUNTER — Encounter: Payer: Self-pay | Admitting: Internal Medicine

## 2022-07-08 ENCOUNTER — Ambulatory Visit: Payer: Medicare HMO | Admitting: Internal Medicine

## 2022-07-08 LAB — BASIC METABOLIC PANEL
BUN: 25 mg/dL — ABNORMAL HIGH (ref 6–23)
CO2: 25 mEq/L (ref 19–32)
Calcium: 11 mg/dL — ABNORMAL HIGH (ref 8.4–10.5)
Chloride: 105 mEq/L (ref 96–112)
Creatinine, Ser: 0.75 mg/dL (ref 0.40–1.20)
GFR: 79.44 mL/min (ref >60.00)
Glucose, Bld: 108 mg/dL — ABNORMAL HIGH (ref 70–99)
Potassium: 3.9 mEq/L (ref 3.5–5.1)
Sodium: 137 mEq/L (ref 135–145)

## 2022-07-08 LAB — ALBUMIN: Albumin: 4.4 g/dL (ref 3.5–5.2)

## 2022-07-08 LAB — VITAMIN D 25 HYDROXY (VIT D DEFICIENCY, FRACTURES): VITD: 31.5 ng/mL (ref 30.00–100.00)

## 2022-07-08 NOTE — Progress Notes (Unsigned)
Name: Loretta Griffin  MRN/ DOB: [id number], 1951/03/19    Age/ Sex: 72 y.o., female    PCP: Glenis Smoker, MD   Reason for Endocrinology Evaluation: Hyperparathyroidism     Date of Initial Endocrinology Evaluation: 07/08/2022     HPI: Ms. Loretta Griffin is a 72 y.o. female with a past medical history of Hx breast  Cancer ( s/p lumpectomy and radiation ). The patient presented for initial endocrinology clinic visit on 07/08/2022 for consultative assistance with her Hyperparathyroidism     Loretta Griffin indicates that she was first diagnosed with hypercalcemia in 05/2022 with a serum calcium 10.4 mg/dL (normal at 10.16 after correction) , not PTH .   She has had hypercalcemia for years.    Since that time, she has not  experienced symptoms of constipation, polydipsia, but has nocturia. She has stiffness of the joints.Denies constipation.    She denies  use of over the counter calcium (including supplements, Tums, Rolaids, or other calcium containing antacids), lithium, HCTZ   She is on vitamin D supplements 2000 iu daily   She denies  history of kidney stones, kidney disease, liver disease, granulomatous disease. She does not have osteoporosis but has osteopenia   (DXA 05/2022), had a foot fracture in 2019 (s/p fall) . Daily dietary calcium intake: 1-2 servings. Mother with  osteoporosis.    Mother and son with hypercalcemia    HISTORY:  Past Medical History:  Past Medical History:  Diagnosis Date   Breast cancer    Complication of anesthesia    Family history of breast cancer 05/10/2021   Personal history of radiation therapy    Past Surgical History:  Past Surgical History:  Procedure Laterality Date   BREAST LUMPECTOMY     BREAST LUMPECTOMY WITH RADIOACTIVE SEED LOCALIZATION Left 06/08/2021   Procedure: LEFT BREAST LUMPECTOMY WITH RADIOACTIVE SEED LOCALIZATION;  Surgeon: Jovita Kussmaul, MD;  Location: [facility];  Service: General;  Laterality:  Left;   cataract surgery     eye lid lift     left knee arthroscopy     left lumpectomy     PLACEMENT OF BREAST IMPLANTS      Social History:  reports that she has never smoked. She has never used smokeless tobacco. She reports that she does not drink alcohol and does not use drugs. Family History: family history includes Breast cancer in her mother and paternal aunt; Lung cancer in her maternal uncle.   HOME MEDICATIONS: Allergies as of 07/08/2022       Reactions   Bimatoprost    Brimonidine Tartrate-timolol    Brinzolamide-brimonidine    Codeine Nausea And Vomiting   vomits   Rhopressa [netarsudil Dimesylate]         Medication List        Accurate as of July 08, 2022  3:57 PM. If you have any questions, ask your nurse or doctor.          Alphagan P 0.1 % Soln Generic drug: brimonidine   cyanocobalamin 500 MCG tablet Commonly known as: VITAMIN B12 Take 5,000 mcg by mouth daily.   D3 50 MCG (2000 UT) Tabs Generic drug: Cholecalciferol Take by mouth.   diphenhydrAMINE 25 mg capsule Commonly known as: BENADRYL Take 25 mg by mouth every 6 (six) hours as needed.   Fish Oil 1000 MG Caps Take 1 capsule by mouth daily.   GLUCOSAMINE 1500 COMPLEX PO Take by mouth.   letrozole 2.5 MG  tablet Commonly known as: FEMARA Take 1 tablet (2.5 mg total) by mouth daily.   PRESERVISION AREDS 2+MULTI VIT PO Take by mouth.   Restasis Multidose 0.05 % ophthalmic emulsion Generic drug: cycloSPORINE INT 1 GTT INTO OU BID   Turmeric 500 MG Caps Take 1,500 mg by mouth daily.   vitamin C 1000 MG tablet 1 tablet   vitamin E 180 MG (400 UNITS) capsule Take 400 Units by mouth daily.          REVIEW OF SYSTEMS: A comprehensive ROS was conducted with the patient and is negative except as per HPI     OBJECTIVE:  VS: BP 122/80 (BP Location: Left Arm, Patient Position: Sitting, Cuff Size: Large)   Pulse 76   Ht 5\' 9"  (1.753 m)   Wt 195 lb 12.8 oz (88.8 kg)   SpO2  97%   BMI 28.91 kg/m    Wt Readings from Last 3 Encounters:  07/08/22 195 lb 12.8 oz (88.8 kg)  05/08/22 196 lb (88.9 kg)  11/05/21 194 lb 3.2 oz (88.1 kg)     EXAM: General: Pt appears well and is in NAD  Eyes: External eye exam normal without stare, lid lag or exophthalmos.  EOM intact.  PERRL.  Neck: General: Supple without adenopathy. Thyroid: Thyroid size normal.  No goiter or nodules appreciated.   Lungs: Clear with good BS bilat   Heart: Auscultation: RRR.  Abdomen: soft, nontender, without masses or organomegaly palpable  Extremities:  BL LE: No pretibial edema normal ROM and strength.  Mental Status: Judgment, insight: Intact Orientation: Oriented to time, place, and person Mood and affect: No depression, anxiety, or agitation     DATA REVIEWED: ***    Old records , labs and images have been reviewed.    ASSESSMENT/PLAN/RECOMMENDATIONS:   Hypercalcemia :  - primary hyperparathyroidsm versus familial hypocalciuric hypercalcemia -Discussed pathophysiology of hyperparathyroidism/hypercalcemia -We will proceed with 24-hour urinary calcium excretion   Recommendations Stay hydrated Avoid over-the-counter calcium tablets Consume 2-3 servings of dietary calcium daily   Follow-up in 6 months     Signed electronically by: Mack Guise, MD  [facility]  [facility] [address] [place], [place] [address] Phone: [phone] FAX: [phone]   CC: Glenis Smoker, MD [place] [address] Phone: [phone] Fax: [phone]   Return to Endocrinology clinic as below: Future Appointments  Date Time Provider [place]  01/08/2023 11:10 AM Kellyann Ordway, Melanie Crazier, MD [facility] None  05/12/2023 11:15 AM Nicholas Lose, MD [facility] None

## 2022-07-08 NOTE — Patient Instructions (Signed)
-   Stay Hydrated  °- Avoid over the counter calcium tablets  °- Consume 2-3 servings of dietary calcium daily ( low fat dairy/ green leafy vegetables)  ° ° °24-Hour Urine Collection ° °You will be collecting your urine for a 24-hour period of time. °Your timer starts with your first urine of the morning (For example - If you first pee at 9AM, your timer will start at 9AM) °Throw away your first urine of the morning °Collect your urine every time you pee for the next 24 hours °STOP your urine collection 24 hours after you started the collection (For example - You would stop at 9AM the day after you started) ° °

## 2022-07-09 LAB — PARATHYROID HORMONE, INTACT (NO CA): PTH: 99 pg/mL — ABNORMAL HIGH (ref 16–77)

## 2022-07-09 LAB — CALCIUM, IONIZED: Calcium, Ion: 5.7 mg/dL — ABNORMAL HIGH (ref 4.7–5.5)

## 2022-07-11 ENCOUNTER — Other Ambulatory Visit: Payer: Self-pay | Admitting: Internal Medicine

## 2022-07-12 ENCOUNTER — Other Ambulatory Visit: Payer: Self-pay | Admitting: Internal Medicine

## 2022-07-12 ENCOUNTER — Other Ambulatory Visit: Payer: Medicare HMO

## 2022-07-12 NOTE — Progress Notes (Unsigned)
Total volume 1900 Start date 4/4 end date 4/5  Start time 7 am end time 7am

## 2022-07-15 LAB — CALCIUM, URINE, 24 HOUR: Calcium, 24H Urine: 805 mg/24 h — ABNORMAL HIGH

## 2022-07-15 LAB — CREATININE, URINE, 24 HOUR: Creatinine, 24H Ur: 1.22 g/(24.h) (ref 0.50–2.15)

## 2022-07-15 LAB — HOUSE ACCOUNT TRACKING

## 2022-07-16 ENCOUNTER — Telehealth: Payer: Self-pay | Admitting: Internal Medicine

## 2022-07-16 NOTE — Telephone Encounter (Signed)
Please let the patient know that she is producing so much calcium in the urine.   Because the levels higher than I have ever seen them, I want to make sure we repeat this in a month   An order has been placed  Thanks

## 2022-07-16 NOTE — Telephone Encounter (Signed)
Patient has been advised and will do the urine collection on 08/06/22 and return on 08/07/22. Patient will be leaving to head out of town on 08/08/22 and want return until the end of June so that why she wants to do a few days early.

## 2022-07-17 ENCOUNTER — Other Ambulatory Visit: Payer: Medicare HMO

## 2022-08-07 ENCOUNTER — Other Ambulatory Visit: Payer: Medicare HMO

## 2022-08-07 DIAGNOSIS — H401131 Primary open-angle glaucoma, bilateral, mild stage: Secondary | ICD-10-CM | POA: Diagnosis not present

## 2022-08-07 NOTE — Progress Notes (Unsigned)
Start date 08/06/2022  End date 08/07/2022 Start time 06:50am  End time 6:50am  Total Volume 

## 2022-08-08 LAB — CREATININE, URINE, 24 HOUR: Creatinine, Urine: 30.6 mg/dL

## 2022-08-08 LAB — CALCIUM, URINE, 24 HOUR

## 2022-08-09 LAB — CALCIUM, URINE, 24 HOUR: Calcium, Urine: 21.9 mg/dL

## 2022-08-21 ENCOUNTER — Ambulatory Visit: Payer: PPO | Admitting: Hematology and Oncology

## 2022-10-21 DIAGNOSIS — R7309 Other abnormal glucose: Secondary | ICD-10-CM | POA: Diagnosis not present

## 2022-10-21 DIAGNOSIS — R7301 Impaired fasting glucose: Secondary | ICD-10-CM | POA: Diagnosis not present

## 2022-12-23 DIAGNOSIS — E213 Hyperparathyroidism, unspecified: Secondary | ICD-10-CM | POA: Diagnosis not present

## 2022-12-24 DIAGNOSIS — E213 Hyperparathyroidism, unspecified: Secondary | ICD-10-CM | POA: Diagnosis not present

## 2022-12-26 DIAGNOSIS — E213 Hyperparathyroidism, unspecified: Secondary | ICD-10-CM | POA: Diagnosis not present

## 2023-01-08 ENCOUNTER — Ambulatory Visit: Payer: Medicare HMO | Admitting: Internal Medicine

## 2023-02-12 ENCOUNTER — Encounter: Payer: Self-pay | Admitting: Adult Health

## 2023-02-12 ENCOUNTER — Other Ambulatory Visit: Payer: Self-pay | Admitting: Adult Health

## 2023-02-12 DIAGNOSIS — Z853 Personal history of malignant neoplasm of breast: Secondary | ICD-10-CM

## 2023-03-01 DIAGNOSIS — Z170 Estrogen receptor positive status [ER+]: Secondary | ICD-10-CM | POA: Diagnosis not present

## 2023-03-01 DIAGNOSIS — J189 Pneumonia, unspecified organism: Secondary | ICD-10-CM | POA: Diagnosis not present

## 2023-03-01 DIAGNOSIS — E876 Hypokalemia: Secondary | ICD-10-CM | POA: Diagnosis not present

## 2023-03-01 DIAGNOSIS — E872 Acidosis, unspecified: Secondary | ICD-10-CM | POA: Diagnosis not present

## 2023-03-01 DIAGNOSIS — R531 Weakness: Secondary | ICD-10-CM | POA: Diagnosis not present

## 2023-03-01 DIAGNOSIS — J90 Pleural effusion, not elsewhere classified: Secondary | ICD-10-CM | POA: Diagnosis not present

## 2023-03-01 DIAGNOSIS — A419 Sepsis, unspecified organism: Secondary | ICD-10-CM | POA: Diagnosis not present

## 2023-03-01 DIAGNOSIS — R110 Nausea: Secondary | ICD-10-CM | POA: Diagnosis not present

## 2023-03-01 DIAGNOSIS — R918 Other nonspecific abnormal finding of lung field: Secondary | ICD-10-CM | POA: Diagnosis not present

## 2023-03-01 DIAGNOSIS — N200 Calculus of kidney: Secondary | ICD-10-CM | POA: Diagnosis not present

## 2023-03-01 DIAGNOSIS — R4182 Altered mental status, unspecified: Secondary | ICD-10-CM | POA: Diagnosis not present

## 2023-03-01 DIAGNOSIS — C50412 Malignant neoplasm of upper-outer quadrant of left female breast: Secondary | ICD-10-CM | POA: Diagnosis not present

## 2023-03-01 DIAGNOSIS — G9341 Metabolic encephalopathy: Secondary | ICD-10-CM | POA: Diagnosis not present

## 2023-03-01 DIAGNOSIS — Z853 Personal history of malignant neoplasm of breast: Secondary | ICD-10-CM | POA: Diagnosis not present

## 2023-03-01 DIAGNOSIS — D72829 Elevated white blood cell count, unspecified: Secondary | ICD-10-CM | POA: Diagnosis not present

## 2023-03-01 DIAGNOSIS — K429 Umbilical hernia without obstruction or gangrene: Secondary | ICD-10-CM | POA: Diagnosis not present

## 2023-03-01 DIAGNOSIS — N3000 Acute cystitis without hematuria: Secondary | ICD-10-CM | POA: Diagnosis not present

## 2023-03-01 DIAGNOSIS — R9082 White matter disease, unspecified: Secondary | ICD-10-CM | POA: Diagnosis not present

## 2023-03-01 DIAGNOSIS — N289 Disorder of kidney and ureter, unspecified: Secondary | ICD-10-CM | POA: Diagnosis not present

## 2023-03-01 DIAGNOSIS — Z743 Need for continuous supervision: Secondary | ICD-10-CM | POA: Diagnosis not present

## 2023-03-01 DIAGNOSIS — D259 Leiomyoma of uterus, unspecified: Secondary | ICD-10-CM | POA: Diagnosis not present

## 2023-03-01 DIAGNOSIS — N179 Acute kidney failure, unspecified: Secondary | ICD-10-CM | POA: Diagnosis not present

## 2023-03-01 DIAGNOSIS — R509 Fever, unspecified: Secondary | ICD-10-CM | POA: Diagnosis not present

## 2023-03-01 DIAGNOSIS — J9811 Atelectasis: Secondary | ICD-10-CM | POA: Diagnosis not present

## 2023-03-01 DIAGNOSIS — E210 Primary hyperparathyroidism: Secondary | ICD-10-CM | POA: Diagnosis not present

## 2023-03-01 DIAGNOSIS — E7800 Pure hypercholesterolemia, unspecified: Secondary | ICD-10-CM | POA: Diagnosis not present

## 2023-03-01 DIAGNOSIS — N136 Pyonephrosis: Secondary | ICD-10-CM | POA: Diagnosis not present

## 2023-03-01 DIAGNOSIS — A4151 Sepsis due to Escherichia coli [E. coli]: Secondary | ICD-10-CM | POA: Diagnosis not present

## 2023-03-01 DIAGNOSIS — I6529 Occlusion and stenosis of unspecified carotid artery: Secondary | ICD-10-CM | POA: Diagnosis not present

## 2023-03-01 DIAGNOSIS — H409 Unspecified glaucoma: Secondary | ICD-10-CM | POA: Diagnosis not present

## 2023-03-01 DIAGNOSIS — G319 Degenerative disease of nervous system, unspecified: Secondary | ICD-10-CM | POA: Diagnosis not present

## 2023-03-01 DIAGNOSIS — R7881 Bacteremia: Secondary | ICD-10-CM | POA: Diagnosis not present

## 2023-03-01 DIAGNOSIS — N281 Cyst of kidney, acquired: Secondary | ICD-10-CM | POA: Diagnosis not present

## 2023-03-27 DIAGNOSIS — M170 Bilateral primary osteoarthritis of knee: Secondary | ICD-10-CM | POA: Diagnosis not present

## 2023-03-31 ENCOUNTER — Ambulatory Visit
Admission: RE | Admit: 2023-03-31 | Discharge: 2023-03-31 | Disposition: A | Discharge status: Home / Self Care | Payer: Medicare HMO | Source: Ambulatory Visit | Attending: Adult Health | Admitting: Adult Health

## 2023-03-31 DIAGNOSIS — Z853 Personal history of malignant neoplasm of breast: Secondary | ICD-10-CM

## 2023-04-01 DIAGNOSIS — Z1330 Encounter for screening examination for mental health and behavioral disorders, unspecified: Secondary | ICD-10-CM | POA: Diagnosis not present

## 2023-04-01 DIAGNOSIS — N132 Hydronephrosis with renal and ureteral calculous obstruction: Secondary | ICD-10-CM | POA: Diagnosis not present

## 2023-04-01 DIAGNOSIS — Z960 Presence of urogenital implants: Secondary | ICD-10-CM | POA: Diagnosis not present

## 2023-04-01 DIAGNOSIS — N3289 Other specified disorders of bladder: Secondary | ICD-10-CM | POA: Diagnosis not present

## 2023-04-01 DIAGNOSIS — N3001 Acute cystitis with hematuria: Secondary | ICD-10-CM | POA: Diagnosis not present

## 2023-04-01 DIAGNOSIS — N200 Calculus of kidney: Secondary | ICD-10-CM | POA: Diagnosis not present

## 2023-04-16 DIAGNOSIS — D351 Benign neoplasm of parathyroid gland: Secondary | ICD-10-CM | POA: Diagnosis not present

## 2023-04-16 DIAGNOSIS — E210 Primary hyperparathyroidism: Secondary | ICD-10-CM | POA: Diagnosis not present

## 2023-04-25 ENCOUNTER — Encounter: Payer: Self-pay | Admitting: Internal Medicine

## 2023-04-25 DIAGNOSIS — N200 Calculus of kidney: Secondary | ICD-10-CM | POA: Diagnosis not present

## 2023-04-30 DIAGNOSIS — Z9089 Acquired absence of other organs: Secondary | ICD-10-CM | POA: Diagnosis not present

## 2023-04-30 DIAGNOSIS — Z9889 Other specified postprocedural states: Secondary | ICD-10-CM | POA: Diagnosis not present

## 2023-04-30 DIAGNOSIS — E0590 Thyrotoxicosis, unspecified without thyrotoxic crisis or storm: Secondary | ICD-10-CM | POA: Diagnosis not present

## 2023-04-30 DIAGNOSIS — E210 Primary hyperparathyroidism: Secondary | ICD-10-CM | POA: Diagnosis not present

## 2023-05-07 DIAGNOSIS — Z1330 Encounter for screening examination for mental health and behavioral disorders, unspecified: Secondary | ICD-10-CM | POA: Diagnosis not present

## 2023-05-07 DIAGNOSIS — N200 Calculus of kidney: Secondary | ICD-10-CM | POA: Diagnosis not present

## 2023-05-12 ENCOUNTER — Inpatient Hospital Stay: Payer: Medicare HMO | Attending: Hematology and Oncology | Admitting: Hematology and Oncology

## 2023-05-12 VITALS — BP 132/78 | HR 75 | Temp 98.2°F | Resp 19 | Ht 69.0 in | Wt 185.6 lb

## 2023-05-12 DIAGNOSIS — Z170 Estrogen receptor positive status [ER+]: Secondary | ICD-10-CM | POA: Diagnosis not present

## 2023-05-12 DIAGNOSIS — Z79811 Long term (current) use of aromatase inhibitors: Secondary | ICD-10-CM | POA: Diagnosis not present

## 2023-05-12 DIAGNOSIS — C50412 Malignant neoplasm of upper-outer quadrant of left female breast: Secondary | ICD-10-CM | POA: Diagnosis not present

## 2023-05-12 MED ORDER — BRIMONIDINE TARTRATE 0.2 % OP SOLN: 1.0000 [drp] | Freq: Three times a day (TID) | OPHTHALMIC | Status: AC

## 2023-05-12 MED ORDER — LETROZOLE 2.5 MG PO TABS: 2.5000 mg | ORAL_TABLET | Freq: Every day | ORAL | 3 refills | Status: DC

## 2023-05-12 NOTE — Progress Notes (Signed)
Patient Care Team: Shon Hale, MD as PCP - General (Family Medicine) Griselda Miner, MD as Consulting Physician (General Surgery) Serena Croissant, MD as Consulting Physician (Hematology and Oncology) Dorothy Puffer, MD as Consulting Physician (Radiation Oncology)  DIAGNOSIS:  Encounter Diagnosis  Name Primary?   Malignant neoplasm of upper-outer quadrant of left breast in female, estrogen receptor positive (HCC) Yes    SUMMARY OF ONCOLOGIC HISTORY: Oncology History  Malignant neoplasm of upper-outer quadrant of left breast in female, estrogen receptor positive (HCC)  04/25/2021 Initial Diagnosis   Screening mammogram detected left breast mass UOQ 2:00: 0.7 cm: Ultrasound biopsy: Benign concordant; additional 0.3 cm distortion: Stereotactic biopsy: Grade 1 IDC ER 100%, PR 100%, HER2 negative, Ki-67 1%   05/09/2021 Cancer Staging   Staging form: Breast, AJCC 8th Edition - Clinical stage from 05/09/2021: Stage IA (cT1a, cN0, cM0, G1, ER+, PR+, HER2-) - Signed by Serena Croissant, MD on 05/09/2021 Stage prefix: Initial diagnosis Histologic grading system: 3 grade system   05/21/2021 Genetic Testing   Negative hereditary cancer genetic testing: no pathogenic variants detected in [facility] CancerNext-Expanded +RNAinsight Panel.  Report date is 05/21/2021.   The CancerNext-Expanded gene panel offered by [facility] and includes sequencing, rearrangement, and RNA analysis for the following 77 genes: AIP, ALK, APC, ATM, AXIN2, BAP1, BARD1, BLM, BMPR1A, BRCA1, BRCA2, BRIP1, CDC73, CDH1, CDK4, CDKN1B, CDKN2A, CHEK2, CTNNA1, DICER1, FANCC, FH, FLCN, GALNT12, KIF1B, LZTR1, MAX, MEN1, MET, MLH1, MSH2, MSH3, MSH6, MUTYH, NBN, NF1, NF2, NTHL1, PALB2, PHOX2B, PMS2, POT1, PRKAR1A, PTCH1, PTEN, RAD51C, RAD51D, RB1, RECQL, RET, SDHA, SDHAF2, SDHB, SDHC, SDHD, SMAD4, SMARCA4, SMARCB1, SMARCE1, STK11, SUFU, TMEM127, TP53, TSC1, TSC2, VHL and XRCC2 (sequencing and deletion/duplication); EGFR, EGLN1, HOXB13, KIT,  MITF, PDGFRA, POLD1, and POLE (sequencing only); EPCAM and GREM1 (deletion/duplication only).    06/08/2021 Surgery   Left lumpectomy: Focal DCIS, no residual invasive cancer, margins negative, ER 100%, PR 100%, HER2 negative, Ki-67 1% (tumor size 2.5 mm based on the biopsy specimen)   07/11/2021 - 08/24/2021 Radiation Therapy   Site Technique Total Dose (Gy) Dose per Fx (Gy) Completed Fx Beam Energies  Breast, Left: Breast_L 3D 50.4/50.4 1.8 28/28 6X  Breast, Left: Breast_L_Bst specialPort 10/10 2 5/5 12E     09/06/2021 -  Anti-estrogen oral therapy   Letrozole x 5-7 years     CHIEF COMPLIANT: Follow-up on letrozole therapy  HISTORY OF PRESENT ILLNESS:   History of Present Illness   The patient, with breast cancer, presents for follow-up regarding her treatment and recent health issues  She is currently on letrozole for her breast cancer, which she has been taking for approximately two years. She experiences manageable hot flashes as a side effect of this medication.  A recent bone density test indicated osteopenia with a T-score of -2.1. She attributes her bone loss to hyperparathyroidism, for which she underwent surgery in January to remove two parathyroid glands. One gland was twenty times larger than normal, and the other was eight times larger. Prior to surgery, she had elevated calcium levels for four years, confirmed by a 24-hour urine test showing calcium levels four times higher than normal. Her son, who had a similar condition, assisted her in finding a surgeon at [facility] who performed the surgery after confirming the diagnosis with a sonogram.  In November of the previous year, she developed a kidney stone that resulted in a severe infection and sepsis, necessitating a six-day hospitalization. A stent was placed, which remains in situ, and she is scheduled  for a procedure to remove the stone at the end of February. She experienced ongoing symptoms, including vomiting for ten days in  June, which she suspects was related to the kidney stone.  She reports improved sleep since the parathyroid surgery, now able to sleep five to six hours uninterrupted, whereas previously she had to urinate every two hours.  She also notes a history of diarrhea, which prevents her from taking vitamin C supplements.         ALLERGIES:  is allergic to bimatoprost, brimonidine tartrate-timolol, brinzolamide-brimonidine, codeine, and rhopressa [netarsudil dimesylate].  MEDICATIONS:  Current Outpatient Medications  Medication Sig Dispense Refill   brimonidine (ALPHAGAN) 0.2 % ophthalmic solution Place 1 drop into both eyes 3 (three) times daily.     Cholecalciferol (D3) 50 MCG (2000 UT) TABS Take by mouth.     diphenhydrAMINE (BENADRYL) 25 mg capsule Take 25 mg by mouth every 6 (six) hours as needed.     Glucosamine-Chondroit-Vit C-Mn (GLUCOSAMINE 1500 COMPLEX PO) Take by mouth.     letrozole (FEMARA) 2.5 MG tablet Take 1 tablet (2.5 mg total) by mouth daily. 90 tablet 3   Multiple Vitamins-Minerals (PRESERVISION AREDS 2+MULTI VIT PO) Take by mouth.     Omega-3 Fatty Acids (FISH OIL) 1000 MG CAPS Take 1 capsule by mouth daily.     RESTASIS MULTIDOSE 0.05 % ophthalmic emulsion INT 1 GTT INTO OU BID  3   Turmeric 500 MG CAPS Take 1,500 mg by mouth daily.     vitamin B-12 (CYANOCOBALAMIN) 500 MCG tablet Take 5,000 mcg by mouth daily.     vitamin E 180 MG (400 UNITS) capsule Take 400 Units by mouth daily.     No current facility-administered medications for this visit.    PHYSICAL EXAMINATION: ECOG PERFORMANCE STATUS: 1 - Symptomatic but completely ambulatory  Vitals:   05/12/23 1123 05/12/23 1125  BP: (!) 144/88 132/78  Pulse: 75   Resp: 19   Temp: 98.2 F (36.8 C)   SpO2: 98%    Filed Weights   05/12/23 1123  Weight: 185 lb 9.6 oz (84.2 kg)     LABORATORY DATA:  I have reviewed the data as listed    Latest Ref Rng & Units 07/08/2022    1:33 PM 05/09/2021   12:36 PM  CMP   Glucose 70 - 99 mg/dL 161  98   BUN 6 - 23 mg/dL 25  20   Creatinine 0.96 - 1.20 mg/dL 0.45  4.09   Sodium 811 - 145 mEq/L 137  140   Potassium 3.5 - 5.1 mEq/L 3.9  4.3   Chloride 96 - 112 mEq/L 105  108   CO2 19 - 32 mEq/L 25  26   Calcium 8.4 - 10.5 mg/dL 91.4  78.2   Total Protein 6.5 - 8.1 g/dL  7.3   Total Bilirubin 0.3 - 1.2 mg/dL  0.3   Alkaline Phos 38 - 126 U/L  115   AST 15 - 41 U/L  12   ALT 0 - 44 U/L  10     Lab Results  Component Value Date   WBC 5.9 05/09/2021   HGB 13.6 05/09/2021   HCT 41.8 05/09/2021   MCV 89.5 05/09/2021   PLT 253 05/09/2021   NEUTROABS 3.7 05/09/2021    ASSESSMENT & PLAN:  Malignant neoplasm of upper-outer quadrant of left breast in female, estrogen receptor positive (HCC) 06/08/2021:Left lumpectomy: Focal DCIS, no residual invasive cancer, margins negative, ER 100%, PR 100%, HER2  negative, Ki-67 1% (tumor size 2.5 mm based on the biopsy specimen)   Treatment plan: 1.  Adjuvant radiation therapy 07/12/2021-08/24/2021 2. adjuvant antiestrogen therapy with letrozole x5 years started 09/06/2021   Letrozole toxicities: Mild hot flashes Joint stiffness but they got better after taking turmeric   Breast cancer surveillance: Mammogram 03/31/2023: Benign breast density category C Bone density 05/10/2022: T-score -2.1: Osteopenia.  Discussed the role of bisphosphonates along with calcium and vitamin D   Return to clinic in 1 year for follow-up ------------------------------------- Assessment and Plan    Breast Cancer (Post-Treatment) Two years post-diagnosis, currently on letrozole with manageable hot flashes. Recent mammogram in December was clear. Discussed continuing letrozole despite bone depletion risk. - Refill letrozole prescription and send to [facility] - Follow-up annually after each mammogram  Osteopenia Bone density test showed a T-score of -2.1. Likely contributed by hyperparathyroidism and letrozole. Discussed bisphosphonates risks,  including osteonecrosis of the jaw. Prefers natural methods to improve bone density. - Discuss calcium supplementation with urologist - Continue vitamin D - Consider natural methods to improve bone density  Hyperparathyroidism (Post-Surgery) Post-surgery for hyperparathyroidism in January, with improved symptoms including better sleep and reduced urinary frequency. - Monitor calcium and parathyroid hormone levels regularly  Kidney Stones Significant kidney stone in November led to sepsis and hospitalization. Stent placed from kidney to bladder, scheduled for laser removal at the end of February. Discussed timely stent removal to prevent complications. - Proceed with scheduled laser procedure to remove kidney stone - Remove stent during the procedure  General Health Maintenance Discussed importance of regular screenings and finding a new family doctor who addresses concerns. - Ensure all medications are up to date and refilled as necessary - Encourage finding a new family doctor who listens to and addresses concerns  Follow-up - Follow-up annually for breast cancer post-treatment - Monitor bone density and calcium levels regularly - Proceed with scheduled kidney stone removal and stent removal at the end of February.          No orders of the defined types were placed in this encounter.  The patient has a good understanding of the overall plan. she agrees with it. she will call with any problems that may develop before the next visit here. Total time spent: 30 mins including face to face time and time spent for planning, charting and co-ordination of care   Tamsen Meek, MD 05/12/23

## 2023-05-12 NOTE — Assessment & Plan Note (Signed)
06/08/2021:Left lumpectomy: Focal DCIS, no residual invasive cancer, margins negative, ER 100%, PR 100%, HER2 negative, Ki-67 1% (tumor size 2.5 mm based on the biopsy specimen)   Treatment plan: 1.  Adjuvant radiation therapy 07/12/2021-08/24/2021 2. adjuvant antiestrogen therapy with letrozole x5 years started 09/06/2021   Letrozole toxicities: Mild hot flashes Joint stiffness but they got better after taking turmeric   Breast cancer surveillance: Mammogram 03/31/2023: Benign breast density category C Bone density 05/10/2022: T-score -2.1: Osteopenia.  Discussed the role of bisphosphonates along with calcium and vitamin D   Return to clinic in 1 year for follow-up

## 2023-05-13 DIAGNOSIS — Z1211 Encounter for screening for malignant neoplasm of colon: Secondary | ICD-10-CM | POA: Diagnosis not present

## 2023-06-02 DIAGNOSIS — Z466 Encounter for fitting and adjustment of urinary device: Secondary | ICD-10-CM | POA: Diagnosis not present

## 2023-06-02 DIAGNOSIS — N200 Calculus of kidney: Secondary | ICD-10-CM | POA: Diagnosis not present

## 2023-06-24 DIAGNOSIS — N200 Calculus of kidney: Secondary | ICD-10-CM | POA: Diagnosis not present

## 2023-07-10 DIAGNOSIS — M1712 Unilateral primary osteoarthritis, left knee: Secondary | ICD-10-CM | POA: Diagnosis not present

## 2023-07-17 DIAGNOSIS — M1712 Unilateral primary osteoarthritis, left knee: Secondary | ICD-10-CM | POA: Diagnosis not present

## 2023-07-17 DIAGNOSIS — M25562 Pain in left knee: Secondary | ICD-10-CM | POA: Diagnosis not present

## 2023-07-24 DIAGNOSIS — M1712 Unilateral primary osteoarthritis, left knee: Secondary | ICD-10-CM | POA: Diagnosis not present

## 2023-07-25 DIAGNOSIS — N2889 Other specified disorders of kidney and ureter: Secondary | ICD-10-CM | POA: Diagnosis not present

## 2023-07-25 DIAGNOSIS — N200 Calculus of kidney: Secondary | ICD-10-CM | POA: Diagnosis not present

## 2023-07-25 DIAGNOSIS — N281 Cyst of kidney, acquired: Secondary | ICD-10-CM | POA: Diagnosis not present

## 2023-08-11 DIAGNOSIS — H401131 Primary open-angle glaucoma, bilateral, mild stage: Secondary | ICD-10-CM | POA: Diagnosis not present

## 2023-09-11 DIAGNOSIS — M25562 Pain in left knee: Secondary | ICD-10-CM | POA: Diagnosis not present

## 2023-09-17 DIAGNOSIS — N200 Calculus of kidney: Secondary | ICD-10-CM | POA: Diagnosis not present

## 2023-10-29 DIAGNOSIS — R82994 Hypercalciuria: Secondary | ICD-10-CM | POA: Diagnosis not present

## 2023-10-29 DIAGNOSIS — Z9089 Acquired absence of other organs: Secondary | ICD-10-CM | POA: Diagnosis not present

## 2023-10-29 DIAGNOSIS — Z9889 Other specified postprocedural states: Secondary | ICD-10-CM | POA: Diagnosis not present

## 2023-10-29 DIAGNOSIS — E210 Primary hyperparathyroidism: Secondary | ICD-10-CM | POA: Diagnosis not present

## 2024-02-10 DIAGNOSIS — Z87442 Personal history of urinary calculi: Secondary | ICD-10-CM | POA: Diagnosis not present

## 2024-02-10 DIAGNOSIS — Z9889 Other specified postprocedural states: Secondary | ICD-10-CM | POA: Diagnosis not present

## 2024-02-10 DIAGNOSIS — Z170 Estrogen receptor positive status [ER+]: Secondary | ICD-10-CM | POA: Diagnosis not present

## 2024-02-10 DIAGNOSIS — E782 Mixed hyperlipidemia: Secondary | ICD-10-CM | POA: Diagnosis not present

## 2024-02-10 DIAGNOSIS — R7301 Impaired fasting glucose: Secondary | ICD-10-CM | POA: Diagnosis not present

## 2024-02-10 DIAGNOSIS — Z853 Personal history of malignant neoplasm of breast: Secondary | ICD-10-CM | POA: Diagnosis not present

## 2024-02-10 DIAGNOSIS — Z9089 Acquired absence of other organs: Secondary | ICD-10-CM | POA: Diagnosis not present

## 2024-02-10 DIAGNOSIS — Z8639 Personal history of other endocrine, nutritional and metabolic disease: Secondary | ICD-10-CM | POA: Diagnosis not present

## 2024-02-10 DIAGNOSIS — R7989 Other specified abnormal findings of blood chemistry: Secondary | ICD-10-CM | POA: Diagnosis not present

## 2024-02-16 ENCOUNTER — Other Ambulatory Visit: Payer: Self-pay

## 2024-02-16 DIAGNOSIS — Z853 Personal history of malignant neoplasm of breast: Secondary | ICD-10-CM

## 2024-04-05 ENCOUNTER — Ambulatory Visit
Admission: RE | Admit: 2024-04-05 | Discharge: 2024-04-05 | Disposition: A | Discharge status: Home / Self Care | Source: Ambulatory Visit

## 2024-04-05 DIAGNOSIS — Z853 Personal history of malignant neoplasm of breast: Secondary | ICD-10-CM

## 2024-05-12 ENCOUNTER — Inpatient Hospital Stay: Payer: Medicare HMO | Admitting: Hematology and Oncology

## 2024-05-12 VITALS — BP 131/71 | HR 71 | Temp 98.5°F | Resp 20 | Ht 69.25 in | Wt 189.5 lb

## 2024-05-12 DIAGNOSIS — C50412 Malignant neoplasm of upper-outer quadrant of left female breast: Secondary | ICD-10-CM | POA: Diagnosis not present

## 2024-05-12 DIAGNOSIS — Z170 Estrogen receptor positive status [ER+]: Secondary | ICD-10-CM | POA: Diagnosis not present

## 2024-05-12 MED ORDER — LETROZOLE 2.5 MG PO TABS: 2.5000 mg | ORAL_TABLET | Freq: Every day | ORAL | 3 refills | Status: AC

## 2024-05-12 NOTE — Progress Notes (Signed)
 "  Patient Care Team: Chrystal Lamarr RAMAN, MD as PCP - General (Family Medicine) Curvin Deward MOULD, MD as Consulting Physician (General Surgery) Odean Potts, MD as Consulting Physician (Hematology and Oncology) Dewey Rush, MD as Consulting Physician (Radiation Oncology)  DIAGNOSIS:  Encounter Diagnosis  Name Primary?   Malignant neoplasm of upper-outer quadrant of left breast in female, estrogen receptor positive (HCC) Yes    SUMMARY OF ONCOLOGIC HISTORY: Oncology History  Malignant neoplasm of upper-outer quadrant of left breast in female, estrogen receptor positive (HCC)  04/25/2021 Initial Diagnosis   Screening mammogram detected left breast mass UOQ 2:00: 0.7 cm: Ultrasound biopsy: Benign concordant; additional 0.3 cm distortion: Stereotactic biopsy: Grade 1 IDC ER 100%, PR 100%, HER2 negative, Ki-67 1%   05/09/2021 Cancer Staging   Staging form: Breast, AJCC 8th Edition - Clinical stage from 05/09/2021: Stage IA (cT1a, cN0, cM0, G1, ER+, PR+, HER2-) - Signed by Odean Potts, MD on 05/09/2021 Stage prefix: Initial diagnosis Histologic grading system: 3 grade system   05/21/2021 Genetic Testing   Negative hereditary cancer genetic testing: no pathogenic variants detected in [facility] CancerNext-Expanded +RNAinsight Panel.  Report date is 05/21/2021.   The CancerNext-Expanded gene panel offered by [facility] and includes sequencing, rearrangement, and RNA analysis for the following 77 genes: AIP, ALK, APC, ATM, AXIN2, BAP1, BARD1, BLM, BMPR1A, BRCA1, BRCA2, BRIP1, CDC73, CDH1, CDK4, CDKN1B, CDKN2A, CHEK2, CTNNA1, DICER1, FANCC, FH, FLCN, GALNT12, KIF1B, LZTR1, MAX, MEN1, MET, MLH1, MSH2, MSH3, MSH6, MUTYH, NBN, NF1, NF2, NTHL1, PALB2, PHOX2B, PMS2, POT1, PRKAR1A, PTCH1, PTEN, RAD51C, RAD51D, RB1, RECQL, RET, SDHA, SDHAF2, SDHB, SDHC, SDHD, SMAD4, SMARCA4, SMARCB1, SMARCE1, STK11, SUFU, TMEM127, TP53, TSC1, TSC2, VHL and XRCC2 (sequencing and deletion/duplication); EGFR, EGLN1, HOXB13, KIT,  MITF, PDGFRA, POLD1, and POLE (sequencing only); EPCAM and GREM1 (deletion/duplication only).    06/08/2021 Surgery   Left lumpectomy: Focal DCIS, no residual invasive cancer, margins negative, ER 100%, PR 100%, HER2 negative, Ki-67 1% (tumor size 2.5 mm based on the biopsy specimen)   07/11/2021 - 08/24/2021 Radiation Therapy   Site Technique Total Dose (Gy) Dose per Fx (Gy) Completed Fx Beam Energies  Breast, Left: Breast_L 3D 50.4/50.4 1.8 28/28 6X  Breast, Left: Breast_L_Bst specialPort 10/10 2 5/5 12E     09/06/2021 -  Anti-estrogen oral therapy   Letrozole  x 5-7 years     CHIEF COMPLIANT: Surveillance of breast cancer  HISTORY OF PRESENT ILLNESS:  History of Present Illness Loretta Griffin is a 74 year old woman with estrogen receptor-positive, stage IA invasive ductal carcinoma of the left breast, status post lumpectomy and adjuvant therapy, presenting for routine oncology follow-up.  She is nearly three years post-lumpectomy for ER-positive left breast cancer and remains in remission on adjuvant letrozole . She requested a refill and reports only mild joint stiffness and hot flashes. Recent surveillance mammogram showed category C breast density. She is anxious about recurrence, influenced by acquaintances with recurrent breast cancer. Her next bone density scan is scheduled.  She had parathyroidectomy last January for primary hyperparathyroidism with normalization of calcium and parathyroid  hormone. Family history includes parathyroid  disease in her son and mother. She recently completed genetic testing at [facility] to evaluate for hereditary cancer syndromes.  She has nephrolithiasis with a prior large stone complicated by sepsis requiring hospitalization, lithotripsy, and stent placement. Hydrochlorothiazide for stone prevention was stopped after seven months because of bradycardia and hypokalemia. She now follows with a new urologist.  She has no other acute concerns today.       ALLERGIES:  is allergic to bimatoprost, brimonidine  tartrate-timolol, brinzolamide-brimonidine , codeine, and rhopressa [netarsudil dimesylate].  MEDICATIONS:  Current Outpatient Medications  Medication Sig Dispense Refill   brimonidine  (ALPHAGAN ) 0.2 % ophthalmic solution Place 1 drop into both eyes 3 (three) times daily.     Cholecalciferol (D3) 50 MCG (2000 UT) TABS Take by mouth.     diphenhydrAMINE (BENADRYL) 25 mg capsule Take 25 mg by mouth every 6 (six) hours as needed.     Glucosamine-Chondroit-Vit C-Mn (GLUCOSAMINE 1500 COMPLEX PO) Take by mouth.     letrozole  (FEMARA ) 2.5 MG tablet Take 1 tablet (2.5 mg total) by mouth daily. 90 tablet 3   Multiple Vitamins-Minerals (PRESERVISION AREDS 2+MULTI VIT PO) Take by mouth.     Omega-3 Fatty Acids (FISH OIL) 1000 MG CAPS Take 1 capsule by mouth daily.     RESTASIS MULTIDOSE 0.05 % ophthalmic emulsion INT 1 GTT INTO OU BID  3   Turmeric 500 MG CAPS Take 1,500 mg by mouth daily.     vitamin B-12 (CYANOCOBALAMIN) 500 MCG tablet Take 5,000 mcg by mouth daily.     vitamin E 180 MG (400 UNITS) capsule Take 400 Units by mouth daily.     No current facility-administered medications for this visit.    PHYSICAL EXAMINATION: ECOG PERFORMANCE STATUS: 1 - Symptomatic but completely ambulatory  Vitals:   05/12/24 1118  BP: 131/71  Pulse: 71  Resp: 20  Temp: 98.5 F (36.9 C)  SpO2: 96%   Filed Weights   05/12/24 1118  Weight: 189 lb 8 oz (86 kg)    LABORATORY DATA:  I have reviewed the data as listed    Latest Ref Rng & Units 07/08/2022    1:33 PM 05/09/2021   12:36 PM  CMP  Glucose 70 - 99 mg/dL 891  98   BUN 6 - 23 mg/dL 25  20   Creatinine 9.59 - 1.20 mg/dL 9.24  9.09   Sodium 864 - 145 mEq/L 137  140   Potassium 3.5 - 5.1 mEq/L 3.9  4.3   Chloride 96 - 112 mEq/L 105  108   CO2 19 - 32 mEq/L 25  26   Calcium 8.4 - 10.5 mg/dL 88.9  89.5   Total Protein 6.5 - 8.1 g/dL  7.3   Total Bilirubin 0.3 - 1.2 mg/dL  0.3   Alkaline  Phos 38 - 126 U/L  115   AST 15 - 41 U/L  12   ALT 0 - 44 U/L  10     Lab Results  Component Value Date   WBC 5.9 05/09/2021   HGB 13.6 05/09/2021   HCT 41.8 05/09/2021   MCV 89.5 05/09/2021   PLT 253 05/09/2021   NEUTROABS 3.7 05/09/2021    ASSESSMENT & PLAN:  Malignant neoplasm of upper-outer quadrant of left breast in female, estrogen receptor positive (HCC) 06/08/2021:Left lumpectomy: Focal DCIS, no residual invasive cancer, margins negative, ER 100%, PR 100%, HER2 negative, Ki-67 1% (tumor size 2.5 mm based on the biopsy specimen)   Treatment plan: 1.  Adjuvant radiation therapy 07/12/2021-08/24/2021 2. adjuvant antiestrogen therapy with letrozole  x5 years started 09/06/2021   Letrozole  toxicities: Mild hot flashes Joint stiffness but they got better after taking turmeric   Breast cancer surveillance: Mammogram 04/05/2024: Benign breast density category C Breast exam 05/12/2024: Benign Bone density 05/10/2022: T-score -2.1: Osteopenia.  Discussed the role of bisphosphonates along with calcium and vitamin D .  This will need to be repeated We will order Signatera for MRD  testing   Hyperparathyroidism: Status post parathyroidectomy Kidney stones: Status post treatment  Return to clinic in 1 year for follow-up  Assessment & Plan Osteopenia Bone density scan is due. - Confirmed repeat bone density scan is scheduled with nurse practitioner. - Continue periodic bone density monitoring.    No orders of the defined types were placed in this encounter.  The patient has a good understanding of the overall plan. she agrees with it. she will call with any problems that may develop before the next visit here.  I personally spent a total of 30 minutes in the care of the patient today including preparing to see the patient, getting/reviewing separately obtained history, performing a medically appropriate exam/evaluation, counseling and educating, placing orders, referring and communicating  with other health care professionals, documenting clinical information in the EHR, independently interpreting results, communicating results, and coordinating care.   Dr.Kenyatte Gruber 05/12/24    "

## 2024-05-12 NOTE — Assessment & Plan Note (Signed)
 06/08/2021:Left lumpectomy: Focal DCIS, no residual invasive cancer, margins negative, ER 100%, PR 100%, HER2 negative, Ki-67 1% (tumor size 2.5 mm based on the biopsy specimen)   Treatment plan: 1.  Adjuvant radiation therapy 07/12/2021-08/24/2021 2. adjuvant antiestrogen therapy with letrozole  x5 years started 09/06/2021   Letrozole  toxicities: Mild hot flashes Joint stiffness but they got better after taking turmeric   Breast cancer surveillance: Mammogram 04/05/2024: Benign breast density category C Breast exam 05/12/2024: Benign Bone density 05/10/2022: T-score -2.1: Osteopenia.  Discussed the role of bisphosphonates along with calcium and vitamin D .  This will need to be repeated   Return to clinic in 1 year for follow-up

## 2025-05-12 ENCOUNTER — Inpatient Hospital Stay: Admitting: Hematology and Oncology
